# Patient Record
Sex: Female | Born: 1955 | Race: White | Hispanic: No | Marital: Married | Smoking: Former smoker
Health system: Southern US, Community
[De-identification: ages and names within clinical notes are randomized; demographics above are authoritative.]

## PROBLEM LIST (undated history)

## (undated) DIAGNOSIS — K469 Unspecified abdominal hernia without obstruction or gangrene: Secondary | ICD-10-CM

## (undated) DIAGNOSIS — E65 Localized adiposity: Secondary | ICD-10-CM | POA: Insufficient documentation

## (undated) DIAGNOSIS — F419 Anxiety disorder, unspecified: Secondary | ICD-10-CM

## (undated) DIAGNOSIS — F329 Major depressive disorder, single episode, unspecified: Secondary | ICD-10-CM

## (undated) DIAGNOSIS — E7439 Other disorders of intestinal carbohydrate absorption: Secondary | ICD-10-CM

## (undated) DIAGNOSIS — C801 Malignant (primary) neoplasm, unspecified: Secondary | ICD-10-CM

## (undated) DIAGNOSIS — G610 Guillain-Barre syndrome: Secondary | ICD-10-CM

## (undated) DIAGNOSIS — E039 Hypothyroidism, unspecified: Secondary | ICD-10-CM

## (undated) DIAGNOSIS — F32A Depression, unspecified: Secondary | ICD-10-CM

## (undated) DIAGNOSIS — E538 Deficiency of other specified B group vitamins: Secondary | ICD-10-CM

## (undated) HISTORY — DX: Malignant (primary) neoplasm, unspecified: C80.1

## (undated) HISTORY — PX: ABDOMINAL SURGERY: SHX537

## (undated) HISTORY — PX: COLONOSCOPY: SHX174

---

## 2014-06-04 DIAGNOSIS — Z9889 Other specified postprocedural states: Secondary | ICD-10-CM | POA: Insufficient documentation

## 2014-10-31 DIAGNOSIS — G40409 Other generalized epilepsy and epileptic syndromes, not intractable, without status epilepticus: Secondary | ICD-10-CM | POA: Insufficient documentation

## 2015-03-13 DIAGNOSIS — Q891 Congenital malformations of adrenal gland: Secondary | ICD-10-CM | POA: Insufficient documentation

## 2015-04-11 DIAGNOSIS — E240 Pituitary-dependent Cushing's disease: Secondary | ICD-10-CM | POA: Insufficient documentation

## 2015-05-06 DIAGNOSIS — Z6841 Body Mass Index (BMI) 40.0 and over, adult: Secondary | ICD-10-CM | POA: Insufficient documentation

## 2015-05-24 HISTORY — PX: KYPHOPLASTY: SHX5884

## 2015-12-24 DIAGNOSIS — R2231 Localized swelling, mass and lump, right upper limb: Secondary | ICD-10-CM | POA: Insufficient documentation

## 2016-03-26 DIAGNOSIS — E249 Cushing's syndrome, unspecified: Secondary | ICD-10-CM | POA: Insufficient documentation

## 2016-06-18 DIAGNOSIS — G8929 Other chronic pain: Secondary | ICD-10-CM | POA: Insufficient documentation

## 2016-06-18 DIAGNOSIS — M545 Low back pain, unspecified: Secondary | ICD-10-CM | POA: Insufficient documentation

## 2016-09-27 ENCOUNTER — Encounter (HOSPITAL_COMMUNITY): Payer: Self-pay | Admitting: *Deleted

## 2016-09-27 ENCOUNTER — Emergency Department (HOSPITAL_COMMUNITY): Payer: BC Managed Care – PPO

## 2016-09-27 ENCOUNTER — Inpatient Hospital Stay (HOSPITAL_COMMUNITY)
Admission: EM | Admit: 2016-09-27 | Discharge: 2016-10-04 | DRG: 835 | Disposition: A | Discharge status: Other Acute Inpatient Hospital | Payer: BC Managed Care – PPO | Attending: Internal Medicine | Admitting: Internal Medicine

## 2016-09-27 DIAGNOSIS — M7981 Nontraumatic hematoma of soft tissue: Secondary | ICD-10-CM | POA: Diagnosis not present

## 2016-09-27 DIAGNOSIS — K439 Ventral hernia without obstruction or gangrene: Secondary | ICD-10-CM | POA: Diagnosis present

## 2016-09-27 DIAGNOSIS — Z9884 Bariatric surgery status: Secondary | ICD-10-CM | POA: Diagnosis not present

## 2016-09-27 DIAGNOSIS — M79609 Pain in unspecified limb: Secondary | ICD-10-CM | POA: Diagnosis not present

## 2016-09-27 DIAGNOSIS — D61818 Other pancytopenia: Secondary | ICD-10-CM | POA: Diagnosis present

## 2016-09-27 DIAGNOSIS — E871 Hypo-osmolality and hyponatremia: Secondary | ICD-10-CM | POA: Diagnosis not present

## 2016-09-27 DIAGNOSIS — K5900 Constipation, unspecified: Secondary | ICD-10-CM | POA: Diagnosis not present

## 2016-09-27 DIAGNOSIS — G610 Guillain-Barre syndrome: Secondary | ICD-10-CM

## 2016-09-27 DIAGNOSIS — E538 Deficiency of other specified B group vitamins: Secondary | ICD-10-CM | POA: Diagnosis not present

## 2016-09-27 DIAGNOSIS — C9100 Acute lymphoblastic leukemia not having achieved remission: Secondary | ICD-10-CM | POA: Diagnosis not present

## 2016-09-27 DIAGNOSIS — E669 Obesity, unspecified: Secondary | ICD-10-CM | POA: Diagnosis not present

## 2016-09-27 DIAGNOSIS — Z87891 Personal history of nicotine dependence: Secondary | ICD-10-CM

## 2016-09-27 DIAGNOSIS — D649 Anemia, unspecified: Secondary | ICD-10-CM

## 2016-09-27 DIAGNOSIS — Z6832 Body mass index (BMI) 32.0-32.9, adult: Secondary | ICD-10-CM | POA: Diagnosis not present

## 2016-09-27 DIAGNOSIS — E876 Hypokalemia: Secondary | ICD-10-CM | POA: Diagnosis not present

## 2016-09-27 DIAGNOSIS — G629 Polyneuropathy, unspecified: Secondary | ICD-10-CM | POA: Diagnosis not present

## 2016-09-27 DIAGNOSIS — Z808 Family history of malignant neoplasm of other organs or systems: Secondary | ICD-10-CM | POA: Diagnosis not present

## 2016-09-27 DIAGNOSIS — E039 Hypothyroidism, unspecified: Secondary | ICD-10-CM | POA: Diagnosis present

## 2016-09-27 DIAGNOSIS — M7989 Other specified soft tissue disorders: Secondary | ICD-10-CM | POA: Diagnosis not present

## 2016-09-27 HISTORY — DX: Depression, unspecified: F32.A

## 2016-09-27 HISTORY — DX: Other disorders of intestinal carbohydrate absorption: E74.39

## 2016-09-27 HISTORY — DX: Anxiety disorder, unspecified: F41.9

## 2016-09-27 HISTORY — DX: Unspecified abdominal hernia without obstruction or gangrene: K46.9

## 2016-09-27 HISTORY — DX: Major depressive disorder, single episode, unspecified: F32.9

## 2016-09-27 HISTORY — DX: Hypothyroidism, unspecified: E03.9

## 2016-09-27 HISTORY — DX: Guillain-Barre syndrome: G61.0

## 2016-09-27 HISTORY — DX: Deficiency of other specified B group vitamins: E53.8

## 2016-09-27 LAB — POC OCCULT BLOOD, ED: Fecal Occult Bld: NEGATIVE

## 2016-09-27 LAB — COMPREHENSIVE METABOLIC PANEL
ALBUMIN: 3.3 g/dL — AB (ref 3.5–5.0)
ALK PHOS: 52 U/L (ref 38–126)
ALT: 16 U/L (ref 14–54)
ANION GAP: 14 (ref 5–15)
AST: 36 U/L (ref 15–41)
BUN: 22 mg/dL — ABNORMAL HIGH (ref 6–20)
CALCIUM: 8.3 mg/dL — AB (ref 8.9–10.3)
CHLORIDE: 97 mmol/L — AB (ref 101–111)
CO2: 20 mmol/L — AB (ref 22–32)
Creatinine, Ser: 0.65 mg/dL (ref 0.44–1.00)
GFR calc Af Amer: >60 mL/min (ref >60)
GFR calc non Af Amer: >60 mL/min (ref >60)
GLUCOSE: 122 mg/dL — AB (ref 65–99)
Potassium: 4.3 mmol/L (ref 3.5–5.1)
SODIUM: 131 mmol/L — AB (ref 135–145)
Total Bilirubin: 1.1 mg/dL (ref 0.3–1.2)
Total Protein: 6.7 g/dL (ref 6.5–8.1)

## 2016-09-27 LAB — URINALYSIS, ROUTINE W REFLEX MICROSCOPIC
BILIRUBIN URINE: NEGATIVE
Bacteria, UA: NONE SEEN
Glucose, UA: NEGATIVE mg/dL
HGB URINE DIPSTICK: NEGATIVE
Ketones, ur: 5 mg/dL — AB
Leukocytes, UA: NEGATIVE
Nitrite: NEGATIVE
Protein, ur: NEGATIVE mg/dL
RBC / HPF: NONE SEEN RBC/hpf (ref 0–5)
Specific Gravity, Urine: 1.015 (ref 1.005–1.030)
pH: 6 (ref 5.0–8.0)

## 2016-09-27 LAB — CBC
HCT: 9.3 % — ABNORMAL LOW (ref 36.0–46.0)
HEMOGLOBIN: 3.2 g/dL — AB (ref 12.0–15.0)
MCH: 31.1 pg (ref 26.0–34.0)
MCHC: 34.4 g/dL (ref 30.0–36.0)
MCV: 90.3 fL (ref 78.0–100.0)
PLATELETS: 10 10*3/uL — AB (ref 150–400)
RBC: 1.03 MIL/uL — AB (ref 3.87–5.11)
RDW: 19.9 % — ABNORMAL HIGH (ref 11.5–15.5)
WBC: 1.5 10*3/uL — ABNORMAL LOW (ref 4.0–10.5)

## 2016-09-27 LAB — FERRITIN: FERRITIN: 359 ng/mL — AB (ref 11–307)

## 2016-09-27 LAB — CBC WITH DIFFERENTIAL/PLATELET
BASOS PCT: 1 %
Basophils Absolute: 0 10*3/uL (ref 0.0–0.1)
EOS PCT: 0 %
Eosinophils Absolute: 0 10*3/uL (ref 0.0–0.7)
HEMATOCRIT: 8.8 % — AB (ref 36.0–46.0)
HEMOGLOBIN: 3 g/dL — AB (ref 12.0–15.0)
Lymphocytes Relative: 82 %
Lymphs Abs: 1.1 10*3/uL (ref 0.7–4.0)
MCH: 30.9 pg (ref 26.0–34.0)
MCHC: 34.1 g/dL (ref 30.0–36.0)
MCV: 90.7 fL (ref 78.0–100.0)
MONO ABS: 0.1 10*3/uL (ref 0.1–1.0)
MONOS PCT: 7 %
NEUTROS PCT: 10 %
Neutro Abs: 0.1 10*3/uL — ABNORMAL LOW (ref 1.7–7.7)
Platelets: 11 10*3/uL — CL (ref 150–400)
RDW: 19.9 % — ABNORMAL HIGH (ref 11.5–15.5)
WBC: 1.3 10*3/uL — CL (ref 4.0–10.5)

## 2016-09-27 LAB — DIC (DISSEMINATED INTRAVASCULAR COAGULATION) PANEL
APTT: 32 s (ref 24–36)
D DIMER QUANT: 1.44 ug{FEU}/mL — AB (ref 0.00–0.50)
FIBRINOGEN: 292 mg/dL (ref 210–475)

## 2016-09-27 LAB — IRON AND TIBC
IRON: 255 ug/dL — AB (ref 28–170)
SATURATION RATIOS: 89 % — AB (ref 10.4–31.8)
TIBC: 286 ug/dL (ref 250–450)
UIBC: 31 ug/dL

## 2016-09-27 LAB — RETICULOCYTES
RBC.: 1.3 MIL/uL — ABNORMAL LOW (ref 3.87–5.11)
RETIC CT PCT: 3.2 % — AB (ref 0.4–3.1)
Retic Count, Absolute: 41.6 10*3/uL (ref 19.0–186.0)

## 2016-09-27 LAB — HEPATIC FUNCTION PANEL
ALK PHOS: 48 U/L (ref 38–126)
ALT: 16 U/L (ref 14–54)
AST: 33 U/L (ref 15–41)
Albumin: 2.9 g/dL — ABNORMAL LOW (ref 3.5–5.0)
BILIRUBIN DIRECT: 0.2 mg/dL (ref 0.1–0.5)
BILIRUBIN INDIRECT: 0.9 mg/dL (ref 0.3–0.9)
TOTAL PROTEIN: 6 g/dL — AB (ref 6.5–8.1)
Total Bilirubin: 1.1 mg/dL (ref 0.3–1.2)

## 2016-09-27 LAB — RAPID URINE DRUG SCREEN, HOSP PERFORMED
Amphetamines: NOT DETECTED
BARBITURATES: NOT DETECTED
Benzodiazepines: NOT DETECTED
Cocaine: NOT DETECTED
Opiates: NOT DETECTED
TETRAHYDROCANNABINOL: NOT DETECTED

## 2016-09-27 LAB — DIC (DISSEMINATED INTRAVASCULAR COAGULATION)PANEL
INR: 1.32
Platelets: 10 10*3/uL — CL (ref 150–400)
Prothrombin Time: 16.5 seconds — ABNORMAL HIGH (ref 11.4–15.2)

## 2016-09-27 LAB — LACTATE DEHYDROGENASE: LDH: 203 U/L — ABNORMAL HIGH (ref 98–192)

## 2016-09-27 LAB — VITAMIN B12: Vitamin B-12: 939 pg/mL — ABNORMAL HIGH (ref 180–914)

## 2016-09-27 LAB — MRSA PCR SCREENING: MRSA BY PCR: NEGATIVE

## 2016-09-27 LAB — SAVE SMEAR

## 2016-09-27 LAB — FOLATE: Folate: 4.4 ng/mL — ABNORMAL LOW (ref >5.9)

## 2016-09-27 LAB — PREPARE RBC (CROSSMATCH)

## 2016-09-27 LAB — ABO/RH: ABO/RH(D): O POS

## 2016-09-27 MED ORDER — SODIUM CHLORIDE 0.9 % IV SOLN
250.0000 mL | INTRAVENOUS | Status: DC | PRN
  Administered 2016-09-27: 250 mL via INTRAVENOUS

## 2016-09-27 MED ORDER — DEXTROSE 5 % IV SOLN
10.0000 mg/kg | Freq: Three times a day (TID) | INTRAVENOUS | Status: DC
  Administered 2016-09-27 – 2016-10-04 (×21): 655 mg via INTRAVENOUS
  Filled 2016-09-27 (×23): qty 13.1

## 2016-09-27 MED ORDER — LEVOFLOXACIN IN D5W 750 MG/150ML IV SOLN
750.0000 mg | Freq: Once | INTRAVENOUS | Status: AC
  Administered 2016-09-27: 750 mg via INTRAVENOUS
  Filled 2016-09-27: qty 150

## 2016-09-27 MED ORDER — SODIUM CHLORIDE 0.9 % IV BOLUS (SEPSIS)
1000.0000 mL | Freq: Once | INTRAVENOUS | Status: AC
  Administered 2016-09-27: 1000 mL via INTRAVENOUS

## 2016-09-27 MED ORDER — LEVOFLOXACIN IN D5W 750 MG/150ML IV SOLN
750.0000 mg | INTRAVENOUS | Status: DC
  Administered 2016-09-28 – 2016-10-03 (×6): 750 mg via INTRAVENOUS
  Filled 2016-09-27 (×6): qty 150

## 2016-09-27 MED ORDER — SODIUM CHLORIDE 0.9% FLUSH
3.0000 mL | Freq: Two times a day (BID) | INTRAVENOUS | Status: DC
  Administered 2016-09-28 – 2016-10-04 (×9): 3 mL via INTRAVENOUS

## 2016-09-27 MED ORDER — FLUCONAZOLE IN SODIUM CHLORIDE 400-0.9 MG/200ML-% IV SOLN
400.0000 mg | INTRAVENOUS | Status: DC
Start: 2016-09-28 — End: 2016-10-04
  Administered 2016-09-28 – 2016-10-03 (×6): 400 mg via INTRAVENOUS
  Filled 2016-09-27 (×7): qty 200

## 2016-09-27 MED ORDER — ONDANSETRON HCL 4 MG/2ML IJ SOLN
4.0000 mg | Freq: Once | INTRAMUSCULAR | Status: AC
  Administered 2016-09-27: 4 mg via INTRAVENOUS
  Filled 2016-09-27: qty 2

## 2016-09-27 MED ORDER — SODIUM CHLORIDE 0.9 % IV SOLN
1000.0000 mL | INTRAVENOUS | Status: DC
  Administered 2016-09-27 – 2016-09-29 (×2): 1000 mL via INTRAVENOUS

## 2016-09-27 MED ORDER — SODIUM CHLORIDE 0.9 % IV SOLN
Freq: Once | INTRAVENOUS | Status: AC
  Administered 2016-09-27: 09:00:00 via INTRAVENOUS

## 2016-09-27 MED ORDER — SODIUM CHLORIDE 0.9% FLUSH: 3.0000 mL | INTRAVENOUS | Status: DC | PRN

## 2016-09-27 MED ORDER — FLUCONAZOLE IN SODIUM CHLORIDE 400-0.9 MG/200ML-% IV SOLN
400.0000 mg | Freq: Once | INTRAVENOUS | Status: AC
  Administered 2016-09-27: 400 mg via INTRAVENOUS
  Filled 2016-09-27: qty 200

## 2016-09-27 MED ORDER — GABAPENTIN 400 MG PO CAPS
800.0000 mg | ORAL_CAPSULE | Freq: Two times a day (BID) | ORAL | Status: DC
  Administered 2016-09-28 – 2016-10-04 (×14): 800 mg via ORAL
  Filled 2016-09-27 (×14): qty 2

## 2016-09-27 NOTE — ED Provider Notes (Signed)
[facility] DEPT Provider Note   CSN: [id number] Arrival date & time: 09/27/16  0558     History   Chief Complaint Chief Complaint  Patient presents with  . Shortness of Breath    HPI Morgan Juarez is a 61 y.o. female presenting with 3 weeks shortness of breath.  Level V caveat as patient states she is unable to answer or doesn't know the answer to many questions.  Patient states that she has a history of Guillain Barre, and since then she has had difficulty breathing. She was first diagnosed 3 years ago. In the past 3 weeks, she reports increasing shortness of breath and weakness. She was seen by her primary care doctor in [place] last week, but does not remember what she was told about her diagnosis. She reports shortness of breath without associated chest pain. Shortness of breath is constant, not worsened with increased activity. She reports chills yesterday, but denies fevers, cough, vomiting, or abdominal pain. She reports frequent episodes of nausea, and states she uses Phenergan to help with the symptoms. She denies urinary sxs and states she has normal BMs without blood.  Patient states that she only takes gabapentin and Cymbalta, and was taken off her [place] recently. 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 [facility] review shows patient is on Klonopin and oxymorphone as well.  Per triage note, patient has a several year history of shortness of breath, and was recently explained by a diagnosis of with abdominal hernia pressing on the diaphragm, for which she will have surgery next week. Husband told EMS that baseline patient is confused and hallucinates, and prior to calling EMS, patient and her husband were arguing. Pt with h/o bowel obstruction 3 yrs prior, but not recent surgeries. At time of arrival in ED, there were no previous medical records visible through Epic or [place].   HPI  Past Medical  History:  Diagnosis Date  . Abdominal hernia   . Anxiety   . B12 deficiency   . Depression   . Glucose intolerance   . Guillain Barr syndrome ([place])   . Hypothyroidism     Patient Active Problem List   Diagnosis Date Noted  . Pancytopenia ([place]) 09/27/2016  . Hyponatremia 09/27/2016  . Anemia requiring transfusions     Past Surgical History:  Procedure Laterality Date  . ABDOMINAL SURGERY    . COLONOSCOPY    . KYPHOPLASTY  05/24/2015    OB History    No data available       Home Medications    Prior to Admission medications   Not on File    Family History Family History  Problem Relation Age of Onset  . Intracerebral hemorrhage Mother   . CAD Father   . Skin cancer Father     Social History Social History  Substance Use Topics  . Smoking status: Former Smoker    Types: Cigarettes    Quit date: 02/22/2013  . Smokeless tobacco: Never Used  . Alcohol use No     Allergies   Patient has no known allergies.   Review of Systems Review of Systems  Constitutional: Negative for chills and fever.  HENT: Negative for congestion and sore throat.   Eyes: Negative for photophobia and visual disturbance.  Respiratory: Positive for shortness of breath. Negative for cough, chest tightness and wheezing.   Cardiovascular: Negative for chest pain and leg swelling.  Gastrointestinal:  Positive for nausea. Negative for abdominal pain, constipation, diarrhea and vomiting.  Genitourinary: Negative for dysuria, frequency and hematuria.  Musculoskeletal: Negative for back pain, neck pain and neck stiffness.  Skin: Negative for rash.  Neurological: Negative for dizziness and light-headedness.  Psychiatric/Behavioral: Positive for confusion.     Physical Exam Updated Vital Signs BP (!) 147/85   Pulse 82   Temp 97.8 F (36.6 C) (Oral)   Resp 16   Ht 5\' 4"  (1.626 m)   Wt 81.6 kg (180 lb)   SpO2 96%   BMI 30.90 kg/m   Physical Exam  Constitutional: She is  oriented to person, place, and time. She appears well-developed and well-nourished.  HENT:  Head: Normocephalic and atraumatic.  Mouth/Throat: Uvula is midline and oropharynx is clear and moist. Mucous membranes are pale and dry.  Eyes: Pupils are equal, round, and reactive to light. Conjunctivae and EOM are normal.  Neck: Normal range of motion. Neck supple.  Cardiovascular: Regular rhythm and intact distal pulses.  Tachycardia present.   Pulmonary/Chest: Effort normal and breath sounds normal. No respiratory distress. She has no wheezes.  Lung sounds clear throughout all fields, patient does not appear to be in any respiratory distress. O2 sats at 100% on room air.  Abdominal: Soft. Bowel sounds are normal. She exhibits no distension. There is no tenderness. There is no guarding.  Musculoskeletal: Normal range of motion.  Lymphadenopathy:    She has no cervical adenopathy.  Neurological: She is alert and oriented to person, place, and time.  Skin: Skin is warm and dry. There is pallor.  Psychiatric:  Patient cannot answer many questions- unclear if this is due to confusion, uncooperativeness, or other cause.  Nursing note and vitals reviewed.    ED Treatments / Results  Labs (all labs ordered are listed, but only abnormal results are displayed) Labs Reviewed  URINALYSIS, ROUTINE W REFLEX MICROSCOPIC - Abnormal; Notable for the following:       Result Value   Ketones, ur 5 (*)    Squamous Epithelial / LPF 0-5 (*)    All other components within normal limits  CBC - Abnormal; Notable for the following:    WBC 1.5 (*)    RBC 1.03 (*)    Hemoglobin 3.2 (*)    HCT 9.3 (*)    RDW 19.9 (*)    Platelets 10 (*)    All other components within normal limits  COMPREHENSIVE METABOLIC PANEL - Abnormal; Notable for the following:    Sodium 131 (*)    Chloride 97 (*)    CO2 20 (*)    Glucose, Bld 122 (*)    BUN 22 (*)    Calcium 8.3 (*)    Albumin 3.3 (*)    All other components within  normal limits  CBC WITH DIFFERENTIAL/PLATELET - Abnormal; Notable for the following:    WBC 1.3 (*)    RBC <1.00 (*)    Hemoglobin 3.0 (*)    HCT 8.8 (*)    RDW 19.9 (*)    Platelets 11 (*)    Neutro Abs 0.1 (*)    All other components within normal limits  VITAMIN B12 - Abnormal; Notable for the following:    Vitamin B-12 939 (*)    All other components within normal limits  FOLATE - Abnormal; Notable for the following:    Folate 4.4 (*)    All other components within normal limits  IRON AND TIBC - Abnormal; Notable for the following:  Iron 255 (*)    Saturation Ratios 89 (*)    All other components within normal limits  FERRITIN - Abnormal; Notable for the following:    Ferritin 359 (*)    All other components within normal limits  RETICULOCYTES - Abnormal; Notable for the following:    Retic Ct Pct 3.2 (*)    RBC. 1.30 (*)    All other components within normal limits  DIC (DISSEMINATED INTRAVASCULAR COAGULATION) PANEL - Abnormal; Notable for the following:    Prothrombin Time 16.5 (*)    D-Dimer, Quant 1.44 (*)    Platelets 10 (*)    All other components within normal limits  HEPATIC FUNCTION PANEL - Abnormal; Notable for the following:    Total Protein 6.0 (*)    Albumin 2.9 (*)    All other components within normal limits  LACTATE DEHYDROGENASE - Abnormal; Notable for the following:    LDH 203 (*)    All other components within normal limits  MRSA PCR SCREENING  CULTURE, BLOOD (ROUTINE X 2)  CULTURE, BLOOD (ROUTINE X 2)  RAPID URINE DRUG SCREEN, HOSP PERFORMED  SAVE SMEAR  PROTEIN ELECTROPHORESIS, SERUM  COMPREHENSIVE METABOLIC PANEL  CBC  CMV IGM  EPSTEIN-BARR VIRUS VCA, IGG  EPSTEIN-BARR VIRUS VCA, IGM  EHRLICHIA ANTIBODY PANEL  HIV ANTIBODY (ROUTINE TESTING)  LYME DISEASE, WESTERN BLOT  RSV(RESPIRATORY SYNCYTIAL VIRUS) AB, BLOOD  I-STAT CHEM 8, ED  POC OCCULT BLOOD, ED  TYPE AND SCREEN  PREPARE RBC (CROSSMATCH)  ABO/RH    EKG  EKG  Interpretation None       Radiology Dg Chest 2 View  Result Date: 09/27/2016 CLINICAL DATA:  Initial evaluation for worsening shortness of breath over past few days. EXAM: CHEST  2 VIEW COMPARISON:  None available. FINDINGS: Cardiac silhouette partially obscured, but grossly within normal limits. Mediastinal silhouette normal. Aortic atherosclerosis. Lungs are hypoinflated. Linear left perihilar opacities most consistent with atelectasis. No focal infiltrates. No pulmonary edema or pleural effusion. No pneumothorax. Chronic compression deformity involving the upper lumbar spine, likely L1 level. No acute osseus abnormality. IMPRESSION: 1. Shallow lung inflation with left perihilar atelectasis. No other active cardiopulmonary disease. 2. Chronic compression deformity involving the L1 vertebral body with secondary accentuated thoracolumbar kyphosis. Electronically Signed   By: Jeannine Boga M.D.   On: 09/27/2016 06:54    Procedures Procedures (including critical care time)  Medications Ordered in ED Medications  sodium chloride 0.9 % bolus 1,000 mL (0 mLs Intravenous Stopped 09/27/16 1001)    Followed by  0.9 %  sodium chloride infusion (0 mLs Intravenous Stopped 09/27/16 1424)  sodium chloride flush (NS) 0.9 % injection 3 mL (3 mLs Intravenous Not Given 09/28/16 1000)  sodium chloride flush (NS) 0.9 % injection 3 mL (not administered)  0.9 %  sodium chloride infusion (250 mLs Intravenous New Bag/Given 09/27/16 1425)  fluconazole (DIFLUCAN) IVPB 400 mg (not administered)  levofloxacin (LEVAQUIN) IVPB 750 mg (not administered)  acyclovir (ZOVIRAX) 655 mg in dextrose 5 % 100 mL IVPB (655 mg Intravenous New Bag/Given 09/28/16 1156)  gabapentin (NEURONTIN) capsule 800 mg (800 mg Oral Given 09/28/16 0953)  ondansetron (ZOFRAN) injection 4 mg (4 mg Intravenous Given 09/27/16 0659)  0.9 %  sodium chloride infusion ( Intravenous Stopped 09/28/16 0811)  fluconazole (DIFLUCAN) IVPB 400 mg (0 mg Intravenous  Stopped 09/27/16 2129)  levofloxacin (LEVAQUIN) IVPB 750 mg (0 mg Intravenous Stopped 09/27/16 2249)     Initial Impression / Assessment and Plan / ED Course  I have reviewed the triage vital signs and the nursing notes.  Pertinent labs & imaging results that were available during my care of the patient were reviewed by me and considered in my medical decision making (see chart for details).  Clinical Course as of Sep 28 1257  Mon Sep 27, 2016  4782 Patient presenting with several years shortness of breath. History is confusing, as patient is not forthcoming with answers. I do not have access to any previous records. As the pt is c/o shortness of breath, will order EKG, EKG, basic labs. UA and UDS ordered. Zofran given for nausea. Patient appears dry, will give bolus of fluids. Patient denying any pain at this time. Physical exam reassuring sats are 100%, and lung sounds are clear, however pt appears pale. Patient does not appear in any respiratory distress at this time.  [[place]]  0745 Informed that the initial blood draws were concerning for being watered-down, and believed to be inaccurate. Labs had be redrawn for accurate results.  [[place]]  0836 Hbg 3.2 and plts 10. Overall pancytopenia. Will start blood transfusion. Will call critical care for admission. Discussed case with attending, and Dr. Roderic Palau has evaluated the patient. Hemoccult negative. No obvious source of bleeding.  [[place]]  9562 Discussed case with Dr. Elsworth Soho from critical care. They will assess the patient, and decide if patient can be admitted to hospitalist. Additionally, will consult hematology for further management.   [[place]]  0909 Dr. Roderic Palau discussed case with Dr. Julien Nordmann from hematology, and heme will evaluate the patient after admission. Will continue with transfusion, and no other therapy recommended at this time.  [[place]]  1030 Discussed with critical care, and advised the patient is stable for hospitalist admission.  [[place]]  1055 Discussed  case with Dr. Maudie Mercury, and patient to be admitted to stepdown unit.  [[place]]    Clinical Course User Index [[place]] Taiden Raybourn, PA-C    Critical Care Time: 45 minutes    Final Clinical Impressions(s) / ED Diagnoses   Final diagnoses:  Anemia requiring transfusions    New Prescriptions There are no discharge medications for this patient.    Franchot Heidelberg, PA-C 09/28/16 1259    Milton Ferguson, MD 09/30/16 [phone]

## 2016-09-27 NOTE — H&P (Signed)
[facility] H&P   Patient Demographics:    Morgan Juarez, is a 61 y.o. female  MRN: [id number]   DOB - 1955-08-01  Admit Date - 09/27/2016  Outpatient Primary MD for the patient is [facility] [place], [place] Referring MD/NP/PA: Roderic Palau  Outpatient Specialists:   Patient coming from: home  Chief Complaint  Patient presents with  . Shortness of Breath      HPI:    Morgan Juarez  is a 61 y.o. 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  Pt denies tick bite,  No prior history of Hiv, Hepatitis B/C,  Pt denies prior blood transfusion.  Pt denies brbpr, black stool.   Pt started saxenda for weight loss recently about w/in the past 90 days along with Linzess. Marland Kitchen  Pt presented to ED   In ED, pt was found to have pancytopenia and signficant anemia,  hgb 3.2  Plt 10,  Wbc 1.5,  Pt  Will be admitted for pancytopenia.      Review of systems:    In addition to the HPI above,  No Fever-chills, No Headache, No changes with Vision or hearing, No problems swallowing food or Liquids, No Chest pain, Cough or  No Abdominal pain, No Nausea or Vommitting, Bowel movements are regular, No Blood in stool or Urine, No dysuria, No new skin rashes or bruises, No new joints pains-aches,  No new weakness, tingling, numbness in any extremity, No recent weight gain or loss, No polyuria, polydypsia or polyphagia, No significant Mental Stressors.  A full 10 point Review of Systems was done, except as stated above, all other Review of Systems were negative.   With Past History of the following :    Past Medical History:  Diagnosis Date  . Abdominal hernia   . Anxiety   . B12 deficiency   .  Depression   . Glucose intolerance   . Guillain Barr syndrome ([place])   . Hypothyroidism       Past Surgical History:  Procedure Laterality Date  . ABDOMINAL SURGERY    . COLONOSCOPY    . KYPHOPLASTY  05/24/2015      Social History:     Social History  Substance Use Topics  . Smoking status: Former Smoker    Types: Cigarettes    Quit date: 02/22/2013  . Smokeless tobacco: Never Used  . Alcohol use No     Lives - at home  Mobility - walk by self, limited, walks with walker or cane   Family History :     Family History  Problem Relation Age of Onset  . Intracerebral hemorrhage Mother   . CAD Father   . Skin cancer Father  Home Medications:   Prior to Admission medications   Not on File     Allergies:    No Known Allergies   Physical Exam:   Vitals  Blood pressure 125/71, pulse 90, temperature 98 F (36.7 C), temperature source Oral, resp. rate 17, height _0  (1.626 m), weight 81.6 kg (180 lb), SpO2 100 %.   1. General  lying in bed in NAD,    2. Normal affect and insight, Not Suicidal or Homicidal, Awake Alert, Oriented X 3.  3. No F.N deficits, ALL C.Nerves Intact, Strength 5/5 all 4 extremities, Sensation intact all 4 extremities, Plantars down going.  4. Ears and Eyes appear Normal, Conjunctivae pale, PERRLA. Moist Oral Mucosa.  5. Supple Neck, No JVD, No cervical lymphadenopathy appriciated, No Carotid Bruits.  6. Symmetrical Chest wall movement, Good air movement bilaterally, CTAB.  7. RRR, No Gallops, Rubs or Murmurs, No Parasternal Heave.  8. Positive Bowel Sounds, Abdomen Soft, No tenderness, No organomegaly appriciated,No rebound -guarding or rigidity.  9.  No Cyanosis, Normal Skin Turgor, No Skin Rash or Bruise.  10. Good muscle tone,  joints appear normal , no effusions, Normal ROM.  11. No Palpable Lymph Nodes in Neck or Axillae    Data Review:    CBC  Recent Labs Lab 09/27/16 0734 09/27/16 0845  WBC 1.5* 1.3*  HGB  3.2* 3.0*  HCT 9.3* 8.8*  PLT 10* 11*  MCV 90.3 90.7  MCH 31.1 30.9  MCHC 34.4 34.1  RDW 19.9* 19.9*  LYMPHSABS  --  1.1  MONOABS  --  0.1  EOSABS  --  0.0  BASOSABS  --  0.0   ------------------------------------------------------------------------------------------------------------------  Chemistries   Recent Labs Lab 09/27/16 0734  NA 131*  K 4.3  CL 97*  CO2 20*  GLUCOSE 122*  BUN 22*  CREATININE 0.65  CALCIUM 8.3*  AST 36  ALT 16  ALKPHOS 52  BILITOT 1.1   ------------------------------------------------------------------------------------------------------------------ estimated creatinine clearance is 75.4 mL/min (by C-G formula based on SCr of 0.65 mg/dL). ------------------------------------------------------------------------------------------------------------------ No results for input(s): TSH, T4TOTAL, T3FREE, THYROIDAB in the last 72 hours.  Invalid input(s): FREET3  Coagulation profile No results for input(s): INR, PROTIME in the last 168 hours. ------------------------------------------------------------------------------------------------------------------- No results for input(s): DDIMER in the last 72 hours. -------------------------------------------------------------------------------------------------------------------  Cardiac Enzymes No results for input(s): CKMB, TROPONINI, MYOGLOBIN in the last 168 hours.  Invalid input(s): CK ------------------------------------------------------------------------------------------------------------------ No results found for: BNP   ---------------------------------------------------------------------------------------------------------------  Urinalysis    Component Value Date/Time   COLORURINE YELLOW 09/27/2016 [place] 09/27/2016 0834   LABSPEC 1.015 09/27/2016 0834   PHURINE 6.0 09/27/2016 [place] 09/27/2016 0834   HGBUR NEGATIVE 09/27/2016 0834    BILIRUBINUR NEGATIVE 09/27/2016 0834   KETONESUR 5 (A) 09/27/2016 0834   PROTEINUR NEGATIVE 09/27/2016 0834   NITRITE NEGATIVE 09/27/2016 0834   LEUKOCYTESUR NEGATIVE 09/27/2016 0834    ----------------------------------------------------------------------------------------------------------------   Imaging Results:    Dg Chest 2 View  Result Date: 09/27/2016 CLINICAL DATA:  Initial evaluation for worsening shortness of breath over past few days. EXAM: CHEST  2 VIEW COMPARISON:  None available. FINDINGS: Cardiac silhouette partially obscured, but grossly within normal limits. Mediastinal silhouette normal. Aortic atherosclerosis. Lungs are hypoinflated. Linear left perihilar opacities most consistent with atelectasis. No focal infiltrates. No pulmonary edema or pleural effusion. No pneumothorax. Chronic compression deformity involving the upper lumbar spine, likely L1 level. No acute osseus abnormality. IMPRESSION: 1. Shallow lung inflation with left perihilar  atelectasis. No other active cardiopulmonary disease. 2. Chronic compression deformity involving the L1 vertebral body with secondary accentuated thoracolumbar kyphosis. Electronically Signed   By: Jeannine Boga M.D.   On: 09/27/2016 06:54      Assessment & Plan:    Active Problems:   Pancytopenia ([facility])   Hyponatremia    Pancytopenia Repeat cbc after transfusion of 4 units prbc Check LDH, lyme, ehrlichiosis, smear,  May need bone marrow  Anemia B12 deficiency Transfuse with 4 units prbc Check b12, folate, ferritin, iron/tibc, spep, upep, esr, LDH  Hyponatremia Check serum osm, tsh, cortisol Check urine sodium, urine osm Check cmp after transfusion  Hypothyroidism Check tsh   DVT Prophylaxis  SCDs   AM Labs Ordered, also please review Full Orders  Family Communication: Admission, patients condition and plan of care including tests being ordered have been discussed with the patient  who indicate understanding  and agree with the plan and Code Status.  Code Status FULL CODE  Likely DC to  home  Condition GUARDED    Consults called: pccm,  Oncology per ED  Admission status: inpatient  Time spent in minutes : 45 minutes   Jani Gravel M.D on 09/27/2016 at 11:51 AM  Between 7am to 7pm - Pager - [phone]. After 7pm go to [email or web address] - password [facility]  [facility] - Office  [phone]

## 2016-09-27 NOTE — ED Notes (Signed)
20 minute timer started 

## 2016-09-27 NOTE — Consult Note (Signed)
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 Subsequently, rapid deterioration of performance status with fatigue, constipation, decreasing oral intake of fluids and food. On presentation, profound neutropenia with absolute neutrophil count 0.1, life-threatening critical anemia with hemoglobin of 3.0 and severe thrombocytopenia with platelets of 1.0.  Differential here includes direct injury of the bone marrow by a viral pathogens or a tickborne illnesses although the patient is reasonably homebound and unlikely to be exposed to ticks easily, chemical injury to the bone marrow, post viral aplastic anemia, acute leukemia. At this time, peripheral blood smear demonstrates no profound schistocytosis. There is not a definite evidence of hemolysis on the labwork either with only minimal elevation of LDH, normal bilirubin. I do not believe that a microangiopathic hemolytic process is taking place at this time. Review of the medication list reveals only promethazine as a potential medication to cause agranulocytosis cytopenia. It leaves profound anemia unexplained. For me, acute leukemia, myelodysplastic syndrome and post viral aplastic anemia are highest in differential. The timeline, the latter is more likely in my opinion. Patient exhibits no symptoms or signs of recurrence of Guillain-Barr this point in time.  First priority of management are hematological support with blood and platelet transfusions with the goals of bring hemoglobin up to 7.0 and platelets over 15,000. Second priority would be prophylaxis of additional  infections development due to the current permissive environment in the patient. Subsequently, urgent workup for possible severe underlying hematological disorder will need to be conducted. My recommendations will be listed below  Recommendations: --Transfuse pRBC to goal of Hgb 7.0; all products leukoreduced and irradiated --Transfuse Plt to keep Plt =>15, all products look reduced and irradiated --Fibrinogen Q12hrs -- keep fibrinogen over 200, and treat with cryoprecipitate if it drops below --Start Levofloxacin, fluconazole and acyclovir for infection prophylaxis --Consult IR for BM Bx tomorrow AM   Thank you for involving [place] in the care of this patient. Our service will continue following the patient on daily basis and we will update our recommendations as more information becomes available.   Orders Placed This Encounter  Procedures  . Culture, blood (Routine X 2) w Reflex to ID Panel    Standing Status:   Standing    Number of Occurrences:   2  . MRSA PCR Screening    Standing Status:   Standing    Number of Occurrences:   1  . DG Chest 2 View    Standing Status:   Standing    Number of Occurrences:   1    Order Specific Question:   Reason for Exam (SYMPTOM  OR DIAGNOSIS REQUIRED)    Answer:   SOB  . Urinalysis, Routine w reflex microscopic    Standing Status:   Standing    Number of Occurrences:   1  . Rapid urine drug screen (hospital performed)    Standing Status:   Standing    Number of Occurrences:   1  . CBC    Standing Status:   Standing    Number of Occurrences:   1  . Comprehensive metabolic panel    Standing Status:   Standing    Number of Occurrences:   1  . CBC with Differential/Platelet  Standing Status:   Standing    Number of Occurrences:   1  . Vitamin B12    Standing Status:   Standing    Number of Occurrences:   1  . Folate    Standing Status:   Standing    Number of Occurrences:   1  . Iron and TIBC    Standing Status:   Standing    Number of  Occurrences:   1  . Ferritin    Standing Status:   Standing    Number of Occurrences:   1  . Reticulocytes    Standing Status:   Standing    Number of Occurrences:   1  . Save smear    Standing Status:   Standing    Number of Occurrences:   1  . DIC (disseminated intravasc coag) panel    Standing Status:   Standing    Number of Occurrences:   1  . Hepatic function panel    Standing Status:   Standing    Number of Occurrences:   1  . Epstein-Barr virus VCA, IgG    Standing Status:   Standing    Number of Occurrences:   1  . Epstein-Barr virus VCA, IgM    Standing Status:   Standing    Number of Occurrences:   1  . RSV(respiratory syncytial virus) ab    Standing Status:   Standing    Number of Occurrences:   1  . CMV IgM    Standing Status:   Standing    Number of Occurrences:   1  . Lyme disease, western blot    Standing Status:   Standing    Number of Occurrences:   1  . Ehrlichia antibody panel    Standing Status:   Standing    Number of Occurrences:   1  . Lactate dehydrogenase    Standing Status:   Standing    Number of Occurrences:   1  . Protein electrophoresis, serum    Standing Status:   Standing    Number of Occurrences:   1  . Sedimentation rate    Standing Status:   Standing    Number of Occurrences:   1  . HIV antibody (Routine Testing)    Standing Status:   Standing    Number of Occurrences:   1  . Comprehensive metabolic panel    Standing Status:   Standing    Number of Occurrences:   1  . CBC    Standing Status:   Standing    Number of Occurrences:   1  . Diet regular Room service appropriate? Yes; Fluid consistency: Thin    Standing Status:   Standing    Number of Occurrences:   1    Order Specific Question:   Room service appropriate?    Answer:   Yes    Order Specific Question:   Fluid consistency:    Answer:   Thin  . Practitioner attestation of consent    I, the ordering practitioner, attest that I have discussed with the patient the  benefits, risks, side effects, alternatives, likelihood of achieving goals and potential problems during recovery for the procedure listed.    Standing Status:   Standing    Number of Occurrences:   1    Order Specific Question:   Procedure    Answer:   Blood product(s)  . Complete patient signature process for consent form    Standing  Status:   Standing    Number of Occurrences:   1  . Expected discharge Date: 09/30/2016; Planned Disposition: Home    Standing Status:   Standing    Number of Occurrences:   1    Order Specific Question:   Date    Answer:   09/30/2016    Order Specific Question:   Planned Disposition    Answer:   Home  . Vital signs    Standing Status:   Standing    Number of Occurrences:   1  . Notify physician    Standing Status:   Standing    Number of Occurrences:   20    Order Specific Question:   Notify Physician    Answer:   for pulse less than 55 or greater than 120    Order Specific Question:   Notify Physician    Answer:   for respiratory rate less than 12 or greater than 25    Order Specific Question:   Notify Physician    Answer:   for temperature greater than 100.5 F    Order Specific Question:   Notify Physician    Answer:   for urinary output less than 30 mL/kg/hr for four hours    Order Specific Question:   Notify Physician    Answer:   for systolic BP less than 90 or greater than 962, diastolic BP less than 60 or greater than 100  . RN may order General Admission PRN Orders utilizing "General Admission PRN medications" (through manage orders) for the following patient needs: allergy symptoms (Claritin), cold sores (Carmex), cough (Robitussin DM), eye irritation (Liquifilm Tears), hemorrhoids (Tucks), indigestion (Maalox), minor skin irritation (Hydrocortisone Cream), muscle pain Suezanne Jacquet Gay), nose irritation (saline nasal spray) and sore throat (Chloraseptic spray).    Standing Status:   Standing    Number of Occurrences:   [id number]  . SCDs    Standing Status:    Standing    Number of Occurrences:   1    Order Specific Question:   Laterality    Answer:   Bilateral  . Cardiac Monitoring Continuous x 24 hours Indications for use: Other; other indications for use: tachycardia    Standing Status:   Standing    Number of Occurrences:   1    Order Specific Question:   Indications for use:    Answer:   Other    Order Specific Question:   other indications for use:    Answer:   tachycardia  . Up with assistance    Standing Status:   Standing    Number of Occurrences:   [id number]  . Full code    Standing Status:   Standing    Number of Occurrences:   1  . Consult to intensivist  [phone]    [phone]    Standing Status:   Standing    Number of Occurrences:   1    Order Specific Question:   Place call to:    Answer:   critical care    Order Specific Question:   Reason for Consult    Answer:   Admit  . Consult to hospitalist  [phone]    [phone]    Standing Status:   Standing    Number of Occurrences:   1    Order Specific Question:   Place call to:    Answer:   [employer] photographer Question:   Reason for Consult  Answer:   Admit  . Pulse oximetry check with vital signs    Standing Status:   Standing    Number of Occurrences:   1  . I-Stat Chem 8, ED    Standing Status:   Standing    Number of Occurrences:   1  . POC occult blood, ED Provider will collect    Standing Status:   Standing    Number of Occurrences:   1    Order Specific Question:   Specimen to be collected by?    Answer:   Provider will collect  . EKG 12-Lead    Standing Status:   Standing    Number of Occurrences:   1  . Type and screen    Standing Status:   Standing    Number of Occurrences:   1  . Prepare RBC    Standing Status:   Standing    Number of Occurrences:   1    Order Specific Question:   # of Units    Answer:   2 units    Order Specific Question:   Transfusion Indications    Answer:   Symptomatic Anemia    Order Specific Question:   If emergent  release call blood bank    Answer:   [facility] [phone]  . ABO/Rh    Standing Status:   Standing    Number of Occurrences:   1  . Admit to Inpatient (patient's expected length of stay will be greater than 2 midnights or inpatient only procedure)    Standing Status:   Standing    Number of Occurrences:   1    Order Specific Question:   Hospital Area    Answer:   [facility] [[id number]]    Order Specific Question:   Level of Care    Answer:   Stepdown [14]    Order Specific Question:   Admit to SDU based on following criteria    Answer:   Hemodynamic compromise or significant risk of instability:  Patient requiring short term acute titration and management of vasoactive drips, and invasive monitoring (i.e., CVP and Arterial line).    Order Specific Question:   Diagnosis    Answer:   Pancytopenia [facility]) [[id number]]    Order Specific Question:   Admitting Physician    Answer:   Jani Gravel [[id number]]    Order Specific Question:   Attending Physician    Answer:   Jani Gravel [[id number]]    Order Specific Question:   Estimated length of stay    Answer:   past midnight tomorrow    Order Specific Question:   Certification:    Answer:   I certify this patient will need inpatient services for at least 2 midnights    Order Specific Question:   PT Class (Do Not Modify)    Answer:   Inpatient [101]    Order Specific Question:   PT Acc Code (Do Not Modify)    Answer:   Private [1]    All questions were answered.  . The patient knows to call the clinic with any problems, questions or concerns.  This note was electronically signed.    History of Presenting Illness Morgan Juarez 61 y.o. referred for hematological consultation afterPresenting to the emergency room by ambulance with complaints of progressive shortness of breath over several days. In the emergency room, patient was found to be profoundly pancytopenic with hemoglobin of 30.0 and platelet count of 10,000. White blood  cell count  is also severely depressed with white blood cell count of 1.3 neutrophil count of 0.1. Patient was admitted to the hospital for further evaluation and we were consulted to assist in the assessment.  On talking to the patient and her husband, she has been declining over approximately 3 weeks with accelerated decline over the past several days. Prior to the onset of the current symptoms, patient had a viral illness with blisters over her lips, cervical lymphadenopathy, abdominal discomfort in the epigastrium, and diarrhea. Subsequently, she had a rapid drop of appetite, weakness, fatigue, and further deterioration of overall energy level and strength. Presently, she continues to have drenching sweats without fevers or chills. She has intermittent nausea, but no or vomiting. Patient reports no bowel movements for the past 3 days. Denies epistaxis, gum bleeding, excessive bruising, persistent lymphadenopathy. She continues to be short of breath although the breathing is improving after she has received 2 units of packed red blood cells. Continues to have nausea, but urinary symptoms. She denies any new focal weakness or sensory deficits and her extremities or face.   Her past medical history is extensive and quite interesting. Previously, patient had abdominal surgeries and subsequently developed lesions which required additional surgical intervention. 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 Following colostomy takedown, patient has developed large ventral hernia which has been a source of significant number of symptoms. She was being prepared to undergo hernia repair surgery in [place], [place]. She has been previously lived there and received older care locally there until recently they have moved to [place] to live closer to their family. It appears that she has developed a Guillain-Barr syndrome following the colostomy takedown surgery following the  administration of influenza vaccine. She was treated with plasma exchange at that time, the initial treatment was complicated with possible seizure and patient was treated with Keppra until recently. No recurrent seizures with subsequent treatments. Patient's lower extremities have recovered substantially, but not 100% following the treatment. She denies any recent exacerbation of her symptoms.  Based on review of the external records, patient's medication list included levothyroxine, liraglutide (Saxenda), topiramate gabapentin, promethazine, linaclotide (Linzess), oxymorphone among others. Patient has not been using Linzess as it resulted in severe diarrhea shortly after initiation. More recently, though the medication has been using was gabapentin and liraglutide.   Oncological/hematological History: --Labs, 06/06/14: WBC 11.6, ANC 7.7, ALC 1.9, Mono 0.9, Baso 0.0, Hgb 10.7, Plt 219; --Labs, 09/27/16: WBC 1.3, ANC 0.1, ALC 1.1, Mono 0.1, Baso 0.0, Hgb 3.0, Retic 3.2%, aRetic 41.6, Plt 11;  Medical History: Past Medical History:  Diagnosis Date  . Abdominal hernia   . Anxiety   . B12 deficiency   . Depression   . Glucose intolerance   . Guillain Barr syndrome ([place])   . Hypothyroidism     Surgical History: Past Surgical History:  Procedure Laterality Date  . ABDOMINAL SURGERY    . COLONOSCOPY    . KYPHOPLASTY  05/24/2015    Family History: Family History  Problem Relation Age of Onset  . Intracerebral hemorrhage Mother   . CAD Father   . Skin cancer Father     Social History: Social History   Social History  . Marital status: Married    Spouse name: N/A  . Number of children: N/A  . Years of education: N/A   Occupational History  . Not on file.   Social History Main Topics  . Smoking status: Former Smoker  Types: Cigarettes    Quit date: 02/22/2013  . Smokeless tobacco: Never Used  . Alcohol use No  . Drug use: No  . Sexual activity: Not on file   Other  Topics Concern  . Not on file   Social History Narrative  . No narrative on file    Allergies: No Known Allergies  Medications:  Current Facility-Administered Medications  Medication Dose Route Frequency Provider Last Rate Last Dose  . 0.9 %  sodium chloride infusion  1,000 mL Intravenous Continuous Caccavale, Sophia, PA-C   Stopped at 09/27/16 1424  . 0.9 %  sodium chloride infusion  250 mL Intravenous PRN Jani Gravel, MD 10 mL/hr at 09/27/16 1425 250 mL at 09/27/16 1425  . sodium chloride flush (NS) 0.9 % injection 3 mL  3 mL Intravenous Q12H Jani Gravel, MD      . sodium chloride flush (NS) 0.9 % injection 3 mL  3 mL Intravenous PRN Jani Gravel, MD        Review of Systems: Review of Systems  Constitutional: Positive for appetite change, diaphoresis, fatigue and unexpected weight change. Negative for chills and fever.  HENT:   Positive for mouth sores. Negative for hearing loss, lump/mass, nosebleeds, sore throat, tinnitus, trouble swallowing and voice change.   Eyes: Negative for eye problems and icterus.  Respiratory: Positive for shortness of breath. Negative for chest tightness, cough, hemoptysis and wheezing.   Cardiovascular: Negative for chest pain, leg swelling and palpitations.  Gastrointestinal: Positive for abdominal distention, abdominal pain, constipation and nausea. Negative for blood in stool, diarrhea, rectal pain and vomiting.  Endocrine: Negative for hot flashes.  Genitourinary: Negative.    Musculoskeletal: Negative.   Skin: Negative.   Hematological: Positive for adenopathy. Does not bruise/bleed easily.  All other systems reviewed and are negative.    PHYSICAL EXAMINATION Blood pressure (P) 138/71, pulse (P) 83, temperature (P) 97.7 F (36.5 C), temperature source (P) Axillary, resp. rate (P) 12, height 5' 4"  (1.626 m), weight 180 lb (81.6 kg), SpO2 (P) 100 %.  ECOG PERFORMANCE STATUS: 4 - Bedbound  Physical Exam  Constitutional: She is oriented to  person, place, and time. Vital signs are normal. She appears not lethargic, to not be writhing in pain, not malnourished, not dehydrated and not jaundiced. She appears unhealthy. She appears not cachectic. She appears toxic. She has a sickly appearance. She appears distressed.  HENT:  Head: Atraumatic.  Mouth/Throat: Oropharynx is clear and moist and mucous membranes are normal. Oral lesions present.  Left upper lip with a cluster of crusted erosion. no active drainage, no erythema of the skin on mucosa, no significant swelling.  Eyes: Pupils are equal, round, and reactive to light. Conjunctivae and EOM are normal. Right eye exhibits no discharge and no exudate. Left eye exhibits no discharge and no exudate. No scleral icterus.  Neck: No JVD present. No tracheal tenderness present. Carotid bruit is not present. No thyroid mass and no thyromegaly present.  Cardiovascular: Regular rhythm, S1 normal and S2 normal.  Tachycardia present.  Exam reveals no gallop and no distant heart sounds.   No murmur heard. Pulmonary/Chest: Breath sounds normal. No stridor. She has no wheezes.  Abdominal:  Abdomen that appears to be asymmetric due to a large ventral hernia. Abdomen appears to be soft, nondistended. It is tender to palpation over the epigastrium and left flank. No rebound tenderness.  Lymphadenopathy:       Head (right side): No submandibular, no posterior auricular and no occipital adenopathy  present.       Head (left side): No submandibular, no posterior auricular and no occipital adenopathy present.    She has no cervical adenopathy.    She has no axillary adenopathy.       Right: No inguinal adenopathy present.       Left: No inguinal adenopathy present.  Neurological: She is oriented to person, place, and time. She appears not lethargic.  Generalized weakness without focal asymmetry.  Skin: Skin is warm and dry. She is not diaphoretic. There is pallor.  No cutaneous lesions. No ecchymosis or  petechiae.     LABORATORY DATA:  I have personally reviewed the data as listed:  Peripheral blood smear: Severe anemia with normocytic normochromic red blood cells possible rouleaux formation, very rare schistocytes and possible micro-spherocytes. No nucleated red blood cells detected. White blood cells are severely decreased in numbers. Almost no mature neutrophils or bands appreciated. No immature forms are visible at this time. Platelets are severely decreased in number, no giant or enlarged platelets noted.  Admission on 09/27/2016  Component Date Value Ref Range Status  . Color, Urine 09/27/2016 YELLOW  YELLOW Final  . APPearance 09/27/2016 CLEAR  CLEAR Final  . Specific Gravity, Urine 09/27/2016 1.015  1.005 - 1.030 Final  . pH 09/27/2016 6.0  5.0 - 8.0 Final  . Glucose, UA 09/27/2016 NEGATIVE  NEGATIVE mg/dL Final  . Hgb urine dipstick 09/27/2016 NEGATIVE  NEGATIVE Final  . Bilirubin Urine 09/27/2016 NEGATIVE  NEGATIVE Final  . Ketones, ur 09/27/2016 5* NEGATIVE mg/dL Final  . Protein, ur 09/27/2016 NEGATIVE  NEGATIVE mg/dL Final  . Nitrite 09/27/2016 NEGATIVE  NEGATIVE Final  . Leukocytes, UA 09/27/2016 NEGATIVE  NEGATIVE Final  . RBC / HPF 09/27/2016 NONE SEEN  0 - 5 RBC/hpf Final  . WBC, UA 09/27/2016 0-5  0 - 5 WBC/hpf Final  . Bacteria, UA 09/27/2016 NONE SEEN  NONE SEEN Final  . Squamous Epithelial / LPF 09/27/2016 0-5* NONE SEEN Final  . Opiates 09/27/2016 NONE DETECTED  NONE DETECTED Final  . Cocaine 09/27/2016 NONE DETECTED  NONE DETECTED Final  . Benzodiazepines 09/27/2016 NONE DETECTED  NONE DETECTED Final  . Amphetamines 09/27/2016 NONE DETECTED  NONE DETECTED Final  . Tetrahydrocannabinol 09/27/2016 NONE DETECTED  NONE DETECTED Final  . Barbiturates 09/27/2016 NONE DETECTED  NONE DETECTED Final   Comment:        DRUG SCREEN FOR MEDICAL PURPOSES ONLY.  IF CONFIRMATION IS NEEDED FOR ANY PURPOSE, NOTIFY LAB WITHIN 5 DAYS.        LOWEST DETECTABLE LIMITS FOR  URINE DRUG SCREEN Drug Class       Cutoff (ng/mL) Amphetamine      1000 Barbiturate      200 Benzodiazepine   989 Tricyclics       211 Opiates          300 Cocaine          300 THC              50   . WBC 09/27/2016 1.5* 4.0 - 10.5 K/uL Final  . RBC 09/27/2016 1.03* 3.87 - 5.11 MIL/uL Final  . Hemoglobin 09/27/2016 3.2* 12.0 - 15.0 g/dL Final   Comment: CRITICAL RESULT CALLED TO, READ BACK BY AND VERIFIED WITH: KHALID, A. RN @0826  ON 8.6.18 BY Alicia VERIFIED VIA RECOLLECT   . HCT 09/27/2016 9.3* 36.0 - 46.0 % Final  . MCV 09/27/2016 90.3  78.0 - 100.0 fL Final  . [facility] 09/27/2016  31.1  26.0 - 34.0 pg Final  . MCHC 09/27/2016 34.4  30.0 - 36.0 g/dL Final  . RDW 09/27/2016 19.9* 11.5 - 15.5 % Final  . Platelets 09/27/2016 10* 150 - 400 K/uL Final   Comment: SPECIMEN CHECKED FOR CLOTS REPEATED TO VERIFY PLATELET COUNT CONFIRMED BY SMEAR CRITICAL RESULT CALLED TO, READ BACK BY AND VERIFIED WITH: KHALID, A. RN @0826  ON 8.6.18 BY [place] VERIFIED VIA RECOLLECT   . Sodium 09/27/2016 131* 135 - 145 mmol/L Final  . Potassium 09/27/2016 4.3  3.5 - 5.1 mmol/L Final  . Chloride 09/27/2016 97* 101 - 111 mmol/L Final  . CO2 09/27/2016 20* 22 - 32 mmol/L Final  . Glucose, Bld 09/27/2016 122* 65 - 99 mg/dL Final  . BUN 09/27/2016 22* 6 - 20 mg/dL Final  . Creatinine, Ser 09/27/2016 0.65  0.44 - 1.00 mg/dL Final  . Calcium 09/27/2016 8.3* 8.9 - 10.3 mg/dL Final  . Total Protein 09/27/2016 6.7  6.5 - 8.1 g/dL Final  . Albumin 09/27/2016 3.3* 3.5 - 5.0 g/dL Final  . AST 09/27/2016 36  15 - 41 U/L Final  . ALT 09/27/2016 16  14 - 54 U/L Final  . Alkaline Phosphatase 09/27/2016 52  38 - 126 U/L Final  . Total Bilirubin 09/27/2016 1.1  0.3 - 1.2 mg/dL Final  . GFR calc non Af Amer 09/27/2016 >60  >60 mL/min Final  . GFR calc Af Amer 09/27/2016 >60  >60 mL/min Final   Comment: (NOTE) The eGFR has been calculated using the CKD EPI equation. This calculation has not been  validated in all clinical situations. eGFR's persistently <60 mL/min signify possible Chronic Kidney Disease.   . Anion gap 09/27/2016 14  5 - 15 Final  . Fecal Occult Bld 09/27/2016 NEGATIVE  NEGATIVE Final  . WBC 09/27/2016 1.3* 4.0 - 10.5 K/uL Final   Comment: REPEATED TO VERIFY CRITICAL RESULT CALLED TO, READ BACK BY AND VERIFIED WITH: KHALID,A. RN @1000  ON 8.6.18 BY NMCCOY   . RBC 09/27/2016 <1.00* 3.87 - 5.11 MIL/uL Final  . Hemoglobin 09/27/2016 3.0* 12.0 - 15.0 g/dL Final   CRITICAL VALUE NOTED.  VALUE IS CONSISTENT WITH PREVIOUSLY REPORTED AND CALLED VALUE.  [place] Kitchen HCT 09/27/2016 8.8* 36.0 - 46.0 % Final  . MCV 09/27/2016 90.7  78.0 - 100.0 fL Final  . MCH 09/27/2016 30.9  26.0 - 34.0 pg Final  . MCHC 09/27/2016 34.1  30.0 - 36.0 g/dL Final  . RDW 09/27/2016 19.9* 11.5 - 15.5 % Final  . Platelets 09/27/2016 11* 150 - 400 K/uL Final   CRITICAL VALUE NOTED.  VALUE IS CONSISTENT WITH PREVIOUSLY REPORTED AND CALLED VALUE.  Marland Kitchen Neutrophils Relative % 09/27/2016 10  % Final  . Lymphocytes Relative 09/27/2016 82  % Final  . Monocytes Relative 09/27/2016 7  % Final  . Eosinophils Relative 09/27/2016 0  % Final  . Basophils Relative 09/27/2016 1  % Final  . Neutro Abs 09/27/2016 0.1* 1.7 - 7.7 K/uL Final  . Lymphs Abs 09/27/2016 1.1  0.7 - 4.0 K/uL Final  . Monocytes Absolute 09/27/2016 0.1  0.1 - 1.0 K/uL Final  . Eosinophils Absolute 09/27/2016 0.0  0.0 - 0.7 K/uL Final  . Basophils Absolute 09/27/2016 0.0  0.0 - 0.1 K/uL Final  . RBC Morphology 09/27/2016 POLYCHROMASIA PRESENT   Final  . ABO/RH(D) 09/27/2016 O POS   Final  . Antibody Screen 09/27/2016 NEG   Final  . Sample Expiration 09/27/2016 09/30/2016   Final  .  Unit Number 09/27/2016 [id number]   Final  . Blood Component Type 09/27/2016 RBC LR PHER2   Final  . Unit division 09/27/2016 00   Final  . Status of Unit 09/27/2016 ISSUED   Final  . Transfusion Status 09/27/2016 OK TO TRANSFUSE   Final  . Crossmatch Result  09/27/2016 Compatible   Final  . Unit Number 09/27/2016 [id number]   Final  . Blood Component Type 09/27/2016 RBC LR PHER2   Final  . Unit division 09/27/2016 00   Final  . Status of Unit 09/27/2016 ISSUED   Final  . Transfusion Status 09/27/2016 OK TO TRANSFUSE   Final  . Crossmatch Result 09/27/2016 Compatible   Final  . Unit Number 09/27/2016 [id number]   Final  . Blood Component Type 09/27/2016 RED CELLS,LR   Final  . Unit division 09/27/2016 00   Final  . Status of Unit 09/27/2016 ISSUED   Final  . Transfusion Status 09/27/2016 OK TO TRANSFUSE   Final  . Crossmatch Result 09/27/2016 Compatible   Final  . Unit Number 09/27/2016 [id number]   Final  . Blood Component Type 09/27/2016 RBC LR PHER1   Final  . Unit division 09/27/2016 00   Final  . Status of Unit 09/27/2016 ALLOCATED   Final  . Transfusion Status 09/27/2016 OK TO TRANSFUSE   Final  . Crossmatch Result 09/27/2016 Compatible   Final  . Order Confirmation 09/27/2016 ORDER PROCESSED BY BLOOD BANK   Final  . ISSUE DATE / TIME 09/27/2016 [id number]   Final  . Blood Product Unit Number 09/27/2016 [id number]   Final  . PRODUCT CODE 09/27/2016 [id number]   Final  . Unit Type and Rh 09/27/2016 5100   Final  . Blood Product Expiration Date 09/27/2016 [id number]   Final  . ISSUE DATE / TIME 09/27/2016 [id number]   Final  . Blood Product Unit Number 09/27/2016 [id number]   Final  . PRODUCT CODE 09/27/2016 [id number]   Final  . Unit Type and Rh 09/27/2016 5100   Final  . Blood Product Expiration Date 09/27/2016 [id number]   Final  . ISSUE DATE / TIME 09/27/2016 [id number]   Final  . Blood Product Unit Number 09/27/2016 [id number]   Final  . PRODUCT CODE 09/27/2016 [id number]   Final  . Unit Type and Rh 09/27/2016 5100   Final  . Blood Product Expiration Date 09/27/2016 [id number]   Final  . Blood Product Unit Number 09/27/2016 [id number]   Final  . Unit Type and Rh 09/27/2016 5100   Final  . Blood  Product Expiration Date 09/27/2016 [id number]   Final  . ABO/RH(D) 09/27/2016 O POS   Final  . Retic Ct Pct 09/27/2016 3.2* 0.4 - 3.1 % Final  . RBC. 09/27/2016 1.30* 3.87 - 5.11 MIL/uL Final  . Retic Count, Absolute 09/27/2016 41.6  19.0 - 186.0 K/uL Final  . Smear Review 09/27/2016 SMEAR STAINED AND AVAILABLE FOR REVIEW   Final  . Prothrombin Time 09/27/2016 16.5* 11.4 - 15.2 seconds Final  . INR 09/27/2016 1.32   Final  . aPTT 09/27/2016 32  24 - 36 seconds Final  . Fibrinogen 09/27/2016 292  210 - 475 mg/dL Final  . D-Dimer, Quant 09/27/2016 1.44* 0.00 - 0.50 ug/mL-FEU Final   Comment: (NOTE) At the manufacturer cut-off of 0.50 ug/mL FEU, this assay has been documented to exclude PE with a sensitivity and negative predictive value of 97 to 99%.  At this time, this assay has not been approved by the FDA to exclude  DVT/VTE. Results should be correlated with clinical presentation.   . Platelets 09/27/2016 10* 150 - 400 K/uL Final   CRITICAL VALUE NOTED.  VALUE IS CONSISTENT WITH PREVIOUSLY REPORTED AND CALLED VALUE.  Marland Kitchen Smear Review 09/27/2016 SCHISTOCYTES NOTED ON SMEAR   Final  . Total Protein 09/27/2016 6.0* 6.5 - 8.1 g/dL Final  . Albumin 09/27/2016 2.9* 3.5 - 5.0 g/dL Final  . AST 09/27/2016 33  15 - 41 U/L Final  . ALT 09/27/2016 16  14 - 54 U/L Final  . Alkaline Phosphatase 09/27/2016 48  38 - 126 U/L Final  . Total Bilirubin 09/27/2016 1.1  0.3 - 1.2 mg/dL Final  . Bilirubin, Direct 09/27/2016 0.2  0.1 - 0.5 mg/dL Final  . Indirect Bilirubin 09/27/2016 0.9  0.3 - 0.9 mg/dL Final  . MRSA by PCR 09/27/2016 NEGATIVE  NEGATIVE Final   Comment:        The GeneXpert MRSA Assay (FDA approved for NASAL specimens only), is one component of a comprehensive MRSA colonization surveillance program. It is not intended to diagnose MRSA infection nor to guide or monitor treatment for MRSA infections.   Marland Kitchen LDH 09/27/2016 203* 98 - 192 U/L Final        Ardath Sax,  MD

## 2016-09-27 NOTE — ED Triage Notes (Signed)
Per EMS pt coming from home, with c/o shortness of breath x 3 years. Pt recently moved to [place] from [place] area. Per EMS pt's husband reports pt was diagnosed with Guillain-Barr in 2015 and has been c/o SOB ever since. Per EMS, pt's husband also reports pt has "massive abdominal hernia that is pressing on diaphragm" and is scheduled for surgery next week. EMS reports pt called them to take her to [place] where her surgeon is, however they didn'thave a clearance to do so. Per EMS, on their assessment pt was not short of breath, lungs sounds are clear, and pt's husband made a comment that pt should stay home, however pt told EMS: "you don't know me, I will just keep calling you back". Per EMS pt husband also reported that at baseline pt is confused and hallucinates.

## 2016-09-27 NOTE — Consult Note (Signed)
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 All her care previously at [facility]. Presents to the ER onn 8/6 w/ CC: estimated 3 weeks of progressive weakness, and worsening exertional dyspnea.  -on eval found to have severe pancytopenia. She was hemodynamically stable.  -no recent sick exposures -current medications reviewed-->no clear incidents of associated pancytopenia -PCCM asked to see to help determine level of care and triage ICU vs SDU setting as well as concern for clinical decline.   PAST MEDICAL HISTORY :  She  has a past medical history of Abdominal hernia; Anxiety; Depression; and Guillain Barr syndrome ([place]).  PAST SURGICAL HISTORY: She  has a past surgical history that includes Abdominal surgery.  Not on File  No current facility-administered medications on file prior to encounter.    No current outpatient prescriptions on file prior to encounter.    FAMILY HISTORY:  Her has no family status information on file.    SOCIAL HISTORY: She   is a non-smoker  Lives w/ husband Just moved to [place]:    Bolds are positive  Constitutional: weight loss (intentional w/ gastric sleeve and medications), gain, night sweats, Fevers, chills, fatigue .  HEENT: headaches, Sore throat, sneezing, nasal congestion, post nasal drip, Difficulty swallowing, Tooth/dental problems, visual complaints visual changes, ear ache CV:  chest pain, radiates,Orthopnea, PND, swelling in lower extremities, dizziness, palpitations, syncope.  GI  heartburn, indigestion, abdominal pain, nausea, vomiting, diarrhea,  change in bowel habits, loss of appetite, bloody stools.  Resp: cough, productive: , hemoptysis, dyspnea worse and progressive over last 3 weeks w/ exertion, chest pain, pleuritic.  Skin: rash or itching or icterus GU: dysuria, change in color of urine, urgency or frequency. flank pain, hematuria  MS: joint pain or swelling. decreased range of motion  Psych: change in mood or affect. depression or anxiety.  Neuro: difficulty with speech, weakness, numbness, ataxia   SUBJECTIVE:  No acute distress.   VITAL SIGNS: BP 114/71   Pulse 94   Temp 97.8 F (36.6 C)   Resp 17   Ht 5\' 4"  (1.626 m)   Wt 180 lb (81.6 kg)   SpO2 100%   BMI 30.90 kg/m   HEMODYNAMICS:    VENTILATOR SETTINGS:    INTAKE / OUTPUT: No intake/output data recorded.  PHYSICAL EXAMINATION: General appearance:  Pale 61 Year old  female, NAD, currently in acute distress Eyes: anicteric sclerae, moist conjunctivae; PERRL, EOMI bilaterally. Mouth:  Pale membranes  normal hard Neck: Trachea midline; neck supple, no JVD Lungs/chest: CTA, with normal respiratory effort and no intercostal retractions CV: RRR, no MRGs  Abdomen: Soft, non-tender; large abd hernia  Extremities: No peripheral edema or extremity lymphadenopathy Skin: Normal temperature, turgor and texture; no rash, ulcers or subcutaneous nodules Psych: Appropriate affect, alert and oriented to person, place and time   LABS:  BMET  Recent Labs Lab 09/27/16 0734  NA 131*  K 4.3  CL 97*  CO2 20*  BUN 22*  CREATININE 0.65  GLUCOSE 122*    Electrolytes  Recent Labs Lab 09/27/16 0734  CALCIUM 8.3*  CBC  Recent Labs Lab 09/27/16 0734 09/27/16 0845  WBC 1.5* 1.3*  HGB 3.2* 3.0*  HCT 9.3* 8.8*  PLT 10* 11*    Coag's No results for input(s): APTT, INR in the last 168 hours.  Sepsis Markers No results for input(s): LATICACIDVEN, PROCALCITON, O2SATVEN in the last 168 hours.  ABG No results for input(s): PHART, PCO2ART, PO2ART  in the last 168 hours.  Liver Enzymes  Recent Labs Lab 09/27/16 0734  AST 36  ALT 16  ALKPHOS 52  BILITOT 1.1  ALBUMIN 3.3*    Cardiac Enzymes No results for input(s): TROPONINI, PROBNP in the last 168 hours.  Glucose No results for input(s): GLUCAP in the last 168 hours.  Imaging Dg Chest 2 View  Result Date: 09/27/2016 CLINICAL DATA:  Initial evaluation for worsening shortness of breath over past few days. EXAM: CHEST  2 VIEW COMPARISON:  None available. FINDINGS: Cardiac silhouette partially obscured, but grossly within normal limits. Mediastinal silhouette normal. Aortic atherosclerosis. Lungs are hypoinflated. Linear left perihilar opacities most consistent with atelectasis. No focal infiltrates. No pulmonary edema or pleural effusion. No pneumothorax. Chronic compression deformity involving the upper lumbar spine, likely L1 level. No acute osseus abnormality. IMPRESSION: 1. Shallow lung inflation with left perihilar atelectasis. No other active cardiopulmonary disease. 2. Chronic compression deformity involving the L1 vertebral body with secondary accentuated thoracolumbar kyphosis. Electronically Signed   By: Jeannine Boga M.D.   On: 09/27/2016 06:54     STUDIES:  blood smear>>> DIC panel >>> general anemia panel>>> Viral screen >>> LFTs>>>  CULTURES: BCX2 8/6>>> Viral panel 8/6>>>  ANTIBIOTICS:   SIGNIFICANT EVENTS:   LINES/TUBES:   ASSESSMENT / PLAN:  Pancytopenia of unclear etiology w/ symptomatic anemia  Extensive MAR reviewed. No clear evidence from current medication list.  Other consideration:malignancy, bone marrow failure or suppression,malnutrition as a consequence from gastric sleeve, infection or some sort of autoimmune disorder.  hgb on admit: 3.2 PLT: 10 WBC 1.5  Plan:  Check coags Check blood smear Check DIC panel  Check general anemia panel Viral screen  LFTs Will need Heme referral as in-patient. Will likely need  BM-BX Transfuse PRBCs Goal > 7 PLTs if PLT < 10K or if < 50K and needs surgical procedure Admit to SDU setting  Dyspnea in setting of symptomatic anemia pcxr personally reviewed. 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 On pharmacological weight loss medication Plan Checking anemia panel (B12 folate specifically) Check pre-albumin  Supportive care  abd hernia  Plan Supportive care  Can be admitted to the SDU setting We will be available as needed. She is currently hemodynamically stable. She needs in-patient heme consult. Have initiated hematologic work-up as we wait. She is currently getting PRBCs.   Erick Colace ACNP-BC [facility] Pager # [phone] OR # [phone] if no answer

## 2016-09-27 NOTE — Care Management Note (Signed)
Case Management Note  Patient Details  Name: Morgan Juarez MRN: [id number] Date of Birth: 09-18-1955  Subjective/Objective:                   anemia hgb 3.2 and shortness of breath/pancytopenia.  Action/Plan: Date:  September 27, 2016 Chart reviewed for concurrent status and case management needs. Will continue to follow patient progress. Discharge Planning: following for needs Expected discharge date: [id number] Velva Harman, BSN, [place], [place]  Expected Discharge Date:                  Expected Discharge Plan:  Home/Self Care  In-House Referral:     Discharge planning Services  CM Consult  Post Acute Care Choice:    Choice offered to:     DME Arranged:    DME Agency:     HH Arranged:    HH Agency:     Status of Service:  In process, will continue to follow  If discussed at Long Length of Stay Meetings, dates discussed:    Additional Comments:  Leeroy Cha, RN 09/27/2016, 1:30 PM

## 2016-09-27 NOTE — ED Notes (Signed)
Unable to read istat chem 8. RN and PA Sophia have been made aware. CBC collected sent to lab

## 2016-09-27 NOTE — Progress Notes (Signed)
Pharmacy Antibiotic Note  Morgan Juarez is a 61 y.o. female with hx Guillain-Barr in 2015 and abdominal hernia (with plan for surgical repair next week) presented to the ED on 09/27/2016 with c/o SOB and weakness.  She was found to have pancytopenia.  Heme/onc consulted with plan for BM biopsy for further workup.  To start fluconazole, acyclovir and levaquin for broad empiric coverage.  Plan: - levaquin 750 mg IV q24h - fluconazole 400 mg IV q24h - acyclovir ~10 mg/kg/dose q8h (used ABW for BMI >30)  ____________________________  Height: 5\' 4"  (162.6 cm) Weight: 180 lb (81.6 kg) IBW/kg (Calculated) : 54.7  Temp (24hrs), Avg:97.9 F (36.6 C), Min:97.7 F (36.5 C), Max:98.4 F (36.9 C)   Recent Labs Lab 09/27/16 0734 09/27/16 0845  WBC 1.5* 1.3*  CREATININE 0.65  --     Estimated Creatinine Clearance: 75.4 mL/min (by C-G formula based on SCr of 0.65 mg/dL).    No Known Allergies   Thank you for allowing pharmacy to be a part of this patient's care.  Lynelle Doctor 09/27/2016 6:36 PM

## 2016-09-27 NOTE — ED Notes (Signed)
Received phone call from Ace, in lab who reports pt's blood results have been deleted. Ace sts he made a conclusion that blood collected was "watered down" by IV fluids because Hgb was 2.9. Informed the caller that labs were collected by straight stick. MD was notified.

## 2016-09-27 NOTE — ED Notes (Signed)
Bed: YJ49 Expected date:  Expected time:  Means of arrival:  Comments: 27f SOB

## 2016-09-27 NOTE — ED Notes (Signed)
Intensivist at the bedisde. They were made aware of pt's WBC results- 1.3

## 2016-09-28 ENCOUNTER — Encounter (HOSPITAL_COMMUNITY): Payer: Self-pay | Admitting: General Surgery

## 2016-09-28 DIAGNOSIS — E039 Hypothyroidism, unspecified: Secondary | ICD-10-CM

## 2016-09-28 LAB — COMPREHENSIVE METABOLIC PANEL
ALK PHOS: 53 U/L (ref 38–126)
ALT: 15 U/L (ref 14–54)
ANION GAP: 9 (ref 5–15)
AST: 28 U/L (ref 15–41)
Albumin: 2.9 g/dL — ABNORMAL LOW (ref 3.5–5.0)
BILIRUBIN TOTAL: 1.3 mg/dL — AB (ref 0.3–1.2)
BUN: 10 mg/dL (ref 6–20)
CALCIUM: 7.2 mg/dL — AB (ref 8.9–10.3)
CO2: 20 mmol/L — ABNORMAL LOW (ref 22–32)
Chloride: 103 mmol/L (ref 101–111)
Creatinine, Ser: 0.51 mg/dL (ref 0.44–1.00)
GLUCOSE: 115 mg/dL — AB (ref 65–99)
POTASSIUM: 3.7 mmol/L (ref 3.5–5.1)
Sodium: 132 mmol/L — ABNORMAL LOW (ref 135–145)
TOTAL PROTEIN: 6.1 g/dL — AB (ref 6.5–8.1)

## 2016-09-28 MED ORDER — SODIUM CHLORIDE 0.9 % IV SOLN
Freq: Once | INTRAVENOUS | Status: AC
  Administered 2016-09-28: 23:00:00 via INTRAVENOUS

## 2016-09-28 MED ORDER — ONDANSETRON HCL 4 MG/2ML IJ SOLN
4.0000 mg | Freq: Four times a day (QID) | INTRAMUSCULAR | Status: DC | PRN
  Administered 2016-09-28 – 2016-10-01 (×6): 4 mg via INTRAVENOUS
  Filled 2016-09-28 (×6): qty 2

## 2016-09-28 MED ORDER — SODIUM CHLORIDE 0.9 % IV SOLN: Freq: Once | INTRAVENOUS | Status: DC

## 2016-09-28 MED ORDER — SODIUM CHLORIDE 0.9% FLUSH
10.0000 mL | INTRAVENOUS | Status: DC | PRN
  Administered 2016-10-01: 10 mL
  Filled 2016-09-28: qty 40

## 2016-09-28 MED ORDER — SODIUM CHLORIDE 0.9% FLUSH
10.0000 mL | Freq: Two times a day (BID) | INTRAVENOUS | Status: DC
  Administered 2016-09-29 – 2016-10-04 (×9): 10 mL

## 2016-09-28 MED ORDER — CHLORHEXIDINE GLUCONATE CLOTH 2 % EX PADS
6.0000 | MEDICATED_PAD | Freq: Every day | CUTANEOUS | Status: DC
  Administered 2016-09-28 – 2016-10-04 (×6): 6 via TOPICAL

## 2016-09-28 NOTE — Consult Note (Signed)
IP PROGRESS NOTE  Subjective:   Patient reports feeling significantly better this morning compared to the admission time. Her breathing is easier, and she feels more alert and awake. Denies any new symptoms.  Objective: Vital signs in last 24 hours: Blood pressure 139/67, pulse 82, temperature (!) 97.3 F (36.3 C), resp. rate 16, height 5\' 4"  (1.626 m), weight 180 lb (81.6 kg), SpO2 100 %.  Intake/Output from previous day: 08/06 0701 - 08/07 0700 In: 1484.8 [I.V.:260.8; Blood:1224] Out: 1000 [Urine:600; Stool:400]  Physical Exam:  HEENT: Anicteric sclerae, pupils are equal and reactive to light, extraocular muscles intact. No new oral sores. Stable ulceration over the left upper lip. Lungs: Clear to auscultation bilaterally without crackles or wheezing. Cardiac: S1/S2, regular, no murmurs, rubs, gallops. Abdomen: Soft, nontender, asymmetric due to large ventral hernia. Extremities: Trace bilateral lower extremity edema. Neuro: No gross focal neurological deficits  Lab Results:  Recent Labs  09/27/16 0734 09/27/16 0845 09/27/16 1404  WBC 1.5* 1.3*  --   HGB 3.2* 3.0*  --   HCT 9.3* 8.8*  --   PLT 10* 11* 10*    BMET  Recent Labs  09/27/16 0734 09/28/16 1410  NA 131* 132*  K 4.3 3.7  CL 97* 103  CO2 20* 20*  GLUCOSE 122* 115*  BUN 22* 10  CREATININE 0.65 0.51  CALCIUM 8.3* 7.2*    Studies/Results: Dg Chest 2 View  Result Date: 09/27/2016 CLINICAL DATA:  Initial evaluation for worsening shortness of breath over past few days. EXAM: CHEST  2 VIEW COMPARISON:  None available. FINDINGS: Cardiac silhouette partially obscured, but grossly within normal limits. Mediastinal silhouette normal. Aortic atherosclerosis. Lungs are hypoinflated. Linear left perihilar opacities most consistent with atelectasis. No focal infiltrates. No pulmonary edema or pleural effusion. No pneumothorax. Chronic compression deformity involving the upper lumbar spine, likely L1 level. No acute  osseus abnormality. IMPRESSION: 1. Shallow lung inflation with left perihilar atelectasis. No other active cardiopulmonary disease. 2. Chronic compression deformity involving the L1 vertebral body with secondary accentuated thoracolumbar kyphosis. Electronically Signed   By: Jeannine Boga M.D.   On: 09/27/2016 06:54    Medications: I have reviewed the patient's current medications.  Assessment/Plan: **Pancytopenia: Patient with history of Guillain-Barr syndrome precipitated by influenza immunizations, presenting with severe pancytopenia of uncertain duration, presumably, 3 weeks preceded by what appears to be a bout of viral infection with lip blistering and ulceration, development of cervical lymphadenopathy followed by abdominal discomfort, worsening nausea, and diarrhea. Subsequently, rapid deterioration of performance status with fatigue, constipation, decreasing oral intake of fluids and food. On presentation, profound neutropenia with absolute neutrophil count 0.1, life-threatening critical anemia with hemoglobin of 3.0 and severe thrombocytopenia with platelets of 1.0. Since admission, patient has received 4 units of packed red blood cells and a single unit of platelets. Obtaining additional lab work has been complicated by poor venous access. Currently, patient is undergoing placement of PICC line will eyes our ability to deliver treatment and obtain additional lab work.  Differential here includes direct injury of the bone marrow by a viral pathogens or a tickborne illnesses although the patient is reasonably homebound and unlikely to be exposed to ticks easily, chemical injury to the bone marrow, post viral aplastic anemia, acute leukemia. At this time, peripheral blood smear demonstrates no profound schistocytosis. There is not a definite evidence of hemolysis on the labwork either with only minimal elevation of LDH, normal bilirubin. I do not believe that a microangiopathic hemolytic  process is  taking place at this time. Review of the medication list reveals only promethazine as a potential medication to cause agranulocytosis cytopenia. It leaves profound anemia unexplained. For me, acute leukemia, myelodysplastic syndrome and post viral aplastic anemia are highest in differential. The timeline, the latter is more likely in my opinion. Patient exhibits no symptoms or signs of recurrence of Guillain-Barr this point in time.  First priority of management are hematological support with blood and platelet transfusions with the goals of bring hemoglobin up to 7.0 and platelets over 15,000. Second priority would be prophylaxis of additional infections development due to the current permissive environment in the patient. Subsequently, urgent workup for possible severe underlying hematological disorder will need to be conducted. My recommendations will be listed below  Recommendations: --Transfuse pRBC to goal of Hgb 7.0; all products leukoreduced and irradiated --Transfuse Plt to keep Plt =>15, all products look reduced and irradiated --Fibrinogen Q12hrs -- keep fibrinogen over 200, and treat with cryoprecipitate if it drops below --Continue Levofloxacin, fluconazole and acyclovir for infection prophylaxis --Consult IR for BM Bx   Thank you for involving [place] in the care of this patient. Our service will continue following the patient on daily basis and we will update our recommendations as more information becomes available.   LOS: 1 day   Ardath Sax, MD   09/28/2016, 5:38 PM

## 2016-09-28 NOTE — Consult Note (Signed)
Chief Complaint: pancytopenia  Referring Physician: Dr. Grace Isaac  Supervising Physician: Arne Cleveland  Patient Status: [facility] - In-pt  HPI: Morgan Juarez is a 61 y.o. 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  She has had a bone marrow biopsy about 3 years ago to assist with the diagnosis of GBS.  She has had about 3 weeks of worsening weakness.  She presented to the ED and her hgb was found to be 3.9 and her platelets around 11.  Hematology has seen her and requested a bone marrow biopsy to help determine an etiology for her findings.    Past Medical History:  Past Medical History:  Diagnosis Date  . Abdominal hernia   . Anxiety   . B12 deficiency   . Depression   . Glucose intolerance   . Guillain Barr syndrome ([place])   . Hypothyroidism     Past Surgical History:  Past Surgical History:  Procedure Laterality Date  . ABDOMINAL SURGERY    . COLONOSCOPY    . KYPHOPLASTY  05/24/2015    Family History:  Family History  Problem Relation Age of Onset  . Intracerebral hemorrhage Mother   . CAD Father   . Skin cancer Father     Social History:  reports that she quit smoking about 3 years ago. Her smoking use included Cigarettes. She has never used smokeless tobacco. She reports that she does not drink alcohol or use drugs.  Allergies: No Known Allergies  Medications: Medications reviewed in epic  Please HPI for pertinent positives, otherwise complete 10 system ROS negative.  Mallampati Score: MD Evaluation Airway: WNL Heart: WNL Abdomen: Other (comments) Abdomen comments: large ventral hernia Chest/ Lungs: WNL ASA  Classification: 3 Mallampati/Airway Score: One  Physical Exam: BP (!) 147/85   Pulse 82   Temp 97.8 F (36.6 C) (Oral)   Resp 16   Ht 5' 4"  (1.626 m)   Wt 180 lb (81.6 kg)   SpO2 96%   BMI 30.90 kg/m  Body mass index is 30.9 kg/m. General: pleasant, obese white female  who is laying in bed in NAD HEENT: head is normocephalic, atraumatic.  Sclera are noninjected.  PERRL.  Ears and nose without any masses or lesions.  Mouth is pink and moist Heart: regular, rate, and rhythm.  Normal s1,s2. No obvious murmurs, gallops, or rubs noted.  Palpable radial pulses bilaterally Lungs: CTAB, no wheezes, rhonchi, or rales noted.  Respiratory effort nonlabored Abd: soft, NT, ND, +BS, no masses or organomegaly.  She has a large incisional/ventral hernia Psych: A&Ox3 with an appropriate affect.   Labs: Results for orders placed or performed during the hospital encounter of 09/27/16 (from the past 48 hour(s))  Prepare RBC     Status: None   Collection Time: 09/27/16  7:00 AM  Result Value Ref Range   Order Confirmation ORDER PROCESSED BY BLOOD BANK   ABO/Rh     Status: None   Collection Time: 09/27/16  7:00 AM  Result Value Ref Range   ABO/RH(D) O POS   CBC     Status: Abnormal   Collection Time: 09/27/16  7:34 AM  Result Value Ref Range   WBC 1.5 (L) 4.0 - 10.5 K/uL   RBC 1.03 (L) 3.87 - 5.11 MIL/uL   Hemoglobin 3.2 (LL) 12.0 - 15.0 g/dL    Comment: CRITICAL RESULT CALLED TO, READ BACK BY AND VERIFIED WITH: KHALID, A. RN @0826  ON  8.6.18 BY NMCCOY RESULTS VERIFIED VIA RECOLLECT    HCT 9.3 (L) 36.0 - 46.0 %   MCV 90.3 78.0 - 100.0 fL   MCH 31.1 26.0 - 34.0 pg   MCHC 34.4 30.0 - 36.0 g/dL   RDW 19.9 (H) 11.5 - 15.5 %   Platelets 10 (LL) 150 - 400 K/uL    Comment: SPECIMEN CHECKED FOR CLOTS REPEATED TO VERIFY PLATELET COUNT CONFIRMED BY SMEAR CRITICAL RESULT CALLED TO, READ BACK BY AND VERIFIED WITH: KHALID, A. RN @0826  ON 8.6.18 BY NMCCOY RESULTS VERIFIED VIA RECOLLECT   Comprehensive metabolic panel     Status: Abnormal   Collection Time: 09/27/16  7:34 AM  Result Value Ref Range   Sodium 131 (L) 135 - 145 mmol/L   Potassium 4.3 3.5 - 5.1 mmol/L   Chloride 97 (L) 101 - 111 mmol/L   CO2 20 (L) 22 - 32 mmol/L   Glucose, Bld 122 (H) 65 - 99 mg/dL   BUN  22 (H) 6 - 20 mg/dL   Creatinine, Ser 0.65 0.44 - 1.00 mg/dL   Calcium 8.3 (L) 8.9 - 10.3 mg/dL   Total Protein 6.7 6.5 - 8.1 g/dL   Albumin 3.3 (L) 3.5 - 5.0 g/dL   AST 36 15 - 41 U/L   ALT 16 14 - 54 U/L   Alkaline Phosphatase 52 38 - 126 U/L   Total Bilirubin 1.1 0.3 - 1.2 mg/dL   GFR calc non Af Amer >60 >60 mL/min   GFR calc Af Amer >60 >60 mL/min    Comment: (NOTE) The eGFR has been calculated using the CKD EPI equation. This calculation has not been validated in all clinical situations. eGFR's persistently <60 mL/min signify possible Chronic Kidney Disease.    Anion gap 14 5 - 15  Type and screen     Status: None   Collection Time: 09/27/16  8:22 AM  Result Value Ref Range   ABO/RH(D) O POS    Antibody Screen NEG    Sample Expiration 09/30/2016    Unit Number [id number]    Blood Component Type RBC LR PHER2    Unit division 00    Status of Unit ISSUED,FINAL    Transfusion Status OK TO TRANSFUSE    Crossmatch Result Compatible    Unit Number [id number]    Blood Component Type RBC LR PHER2    Unit division 00    Status of Unit ISSUED,FINAL    Transfusion Status OK TO TRANSFUSE    Crossmatch Result Compatible    Unit Number [id number]    Blood Component Type RED CELLS,LR    Unit division 00    Status of Unit ISSUED,FINAL    Transfusion Status OK TO TRANSFUSE    Crossmatch Result Compatible    Unit Number [id number]    Blood Component Type RBC LR PHER1    Unit division 00    Status of Unit ISSUED,FINAL    Transfusion Status OK TO TRANSFUSE    Crossmatch Result Compatible   Urinalysis, Routine w reflex microscopic     Status: Abnormal   Collection Time: 09/27/16  8:34 AM  Result Value Ref Range   Color, Urine YELLOW YELLOW   APPearance CLEAR CLEAR   Specific Gravity, Urine 1.015 1.005 - 1.030   pH 6.0 5.0 - 8.0   Glucose, UA NEGATIVE NEGATIVE mg/dL   Hgb urine dipstick NEGATIVE NEGATIVE   Bilirubin Urine NEGATIVE NEGATIVE   Ketones, ur 5 (A)  NEGATIVE mg/dL   Protein,  ur NEGATIVE NEGATIVE mg/dL   Nitrite NEGATIVE NEGATIVE   Leukocytes, UA NEGATIVE NEGATIVE   RBC / HPF NONE SEEN 0 - 5 RBC/hpf   WBC, UA 0-5 0 - 5 WBC/hpf   Bacteria, UA NONE SEEN NONE SEEN   Squamous Epithelial / LPF 0-5 (A) NONE SEEN  Rapid urine drug screen (hospital performed)     Status: None   Collection Time: 09/27/16  8:34 AM  Result Value Ref Range   Opiates NONE DETECTED NONE DETECTED   Cocaine NONE DETECTED NONE DETECTED   Benzodiazepines NONE DETECTED NONE DETECTED   Amphetamines NONE DETECTED NONE DETECTED   Tetrahydrocannabinol NONE DETECTED NONE DETECTED   Barbiturates NONE DETECTED NONE DETECTED    Comment:        DRUG SCREEN FOR MEDICAL PURPOSES ONLY.  IF CONFIRMATION IS NEEDED FOR ANY PURPOSE, NOTIFY LAB WITHIN 5 DAYS.        LOWEST DETECTABLE LIMITS FOR URINE DRUG SCREEN Drug Class       Cutoff (ng/mL) Amphetamine      1000 Barbiturate      200 Benzodiazepine   122 Tricyclics       482 Opiates          300 Cocaine          300 THC              50   POC occult blood, ED Provider will collect     Status: None   Collection Time: 09/27/16  8:36 AM  Result Value Ref Range   Fecal Occult Bld NEGATIVE NEGATIVE  CBC with Differential/Platelet     Status: Abnormal   Collection Time: 09/27/16  8:45 AM  Result Value Ref Range   WBC 1.3 (LL) 4.0 - 10.5 K/uL    Comment: REPEATED TO VERIFY CRITICAL RESULT CALLED TO, READ BACK BY AND VERIFIED WITH: KHALID,A. RN @1000  ON 8.6.18 BY NMCCOY    RBC <1.00 (L) 3.87 - 5.11 MIL/uL   Hemoglobin 3.0 (LL) 12.0 - 15.0 g/dL    Comment: CRITICAL VALUE NOTED.  VALUE IS CONSISTENT WITH PREVIOUSLY REPORTED AND CALLED VALUE.   HCT 8.8 (L) 36.0 - 46.0 %   MCV 90.7 78.0 - 100.0 fL   MCH 30.9 26.0 - 34.0 pg   MCHC 34.1 30.0 - 36.0 g/dL   RDW 19.9 (H) 11.5 - 15.5 %   Platelets 11 (LL) 150 - 400 K/uL    Comment: CRITICAL VALUE NOTED.  VALUE IS CONSISTENT WITH PREVIOUSLY REPORTED AND CALLED VALUE.    Neutrophils Relative % 10 %   Lymphocytes Relative 82 %   Monocytes Relative 7 %   Eosinophils Relative 0 %   Basophils Relative 1 %   Neutro Abs 0.1 (L) 1.7 - 7.7 K/uL   Lymphs Abs 1.1 0.7 - 4.0 K/uL   Monocytes Absolute 0.1 0.1 - 1.0 K/uL   Eosinophils Absolute 0.0 0.0 - 0.7 K/uL   Basophils Absolute 0.0 0.0 - 0.1 K/uL   RBC Morphology POLYCHROMASIA PRESENT   Folate     Status: Abnormal   Collection Time: 09/27/16 10:45 AM  Result Value Ref Range   Folate 4.4 (L) >5.9 ng/mL    Comment: Performed at [facility], [place] [address] [place], [place] [address]  Reticulocytes     Status: Abnormal   Collection Time: 09/27/16 10:45 AM  Result Value Ref Range   Retic Ct Pct 3.2 (H) 0.4 - 3.1 %   RBC. 1.30 (L) 3.87 -  5.11 MIL/uL   Retic Count, Absolute 41.6 19.0 - 186.0 K/uL  Hepatic function panel     Status: Abnormal   Collection Time: 09/27/16 10:45 AM  Result Value Ref Range   Total Protein 6.0 (L) 6.5 - 8.1 g/dL   Albumin 2.9 (L) 3.5 - 5.0 g/dL   AST 33 15 - 41 U/L   ALT 16 14 - 54 U/L   Alkaline Phosphatase 48 38 - 126 U/L   Total Bilirubin 1.1 0.3 - 1.2 mg/dL   Bilirubin, Direct 0.2 0.1 - 0.5 mg/dL   Indirect Bilirubin 0.9 0.3 - 0.9 mg/dL  MRSA PCR Screening     Status: None   Collection Time: 09/27/16 11:51 AM  Result Value Ref Range   MRSA by PCR NEGATIVE NEGATIVE    Comment:        The GeneXpert MRSA Assay (FDA approved for NASAL specimens only), is one component of a comprehensive MRSA colonization surveillance program. It is not intended to diagnose MRSA infection nor to guide or monitor treatment for MRSA infections.   Vitamin B12     Status: Abnormal   Collection Time: 09/27/16  1:29 PM  Result Value Ref Range   Vitamin B-12 939 (H) 180 - 914 pg/mL    Comment: (NOTE) This assay is not validated for testing neonatal or myeloproliferative syndrome specimens for Vitamin B12 levels. Performed at [facility], [place] [address], [place],  [place] [address]   Iron and TIBC     Status: Abnormal   Collection Time: 09/27/16  1:29 PM  Result Value Ref Range   Iron 255 (H) 28 - 170 ug/dL   TIBC 286 250 - 450 ug/dL   Saturation Ratios 89 (H) 10.4 - 31.8 %   UIBC 31 ug/dL    Comment: Performed at [facility], [place] [address], [place], [place] [address]  Ferritin     Status: Abnormal   Collection Time: 09/27/16  1:29 PM  Result Value Ref Range   Ferritin 359 (H) 11 - 307 ng/mL    Comment: Performed at [facility], [place] [address] [place], [place] [address]  Lactate dehydrogenase     Status: Abnormal   Collection Time: 09/27/16  1:30 PM  Result Value Ref Range   LDH 203 (H) 98 - 192 U/L  Save smear     Status: None   Collection Time: 09/27/16  2:04 PM  Result Value Ref Range   Smear Review SMEAR STAINED AND AVAILABLE FOR REVIEW   DIC (disseminated intravasc coag) panel     Status: Abnormal   Collection Time: 09/27/16  2:04 PM  Result Value Ref Range   Prothrombin Time 16.5 (H) 11.4 - 15.2 seconds   INR 1.32    aPTT 32 24 - 36 seconds   Fibrinogen 292 210 - 475 mg/dL   D-Dimer, Quant 1.44 (H) 0.00 - 0.50 ug/mL-FEU    Comment: (NOTE) At the manufacturer cut-off of 0.50 ug/mL FEU, this assay has been documented to exclude PE with a sensitivity and negative predictive value of 97 to 99%.  At this time, this assay has not been approved by the FDA to exclude DVT/VTE. Results should be correlated with clinical presentation.    Platelets 10 (LL) 150 - 400 K/uL    Comment: CRITICAL VALUE NOTED.  VALUE IS CONSISTENT WITH PREVIOUSLY REPORTED AND CALLED VALUE.   Smear Review SCHISTOCYTES NOTED ON SMEAR   Prepare Pheresed Platelets     Status:  None (Preliminary result)   Collection Time: 09/28/16  2:07 PM  Result Value Ref Range   Unit Number [id number]    Blood Component Type PLTP LR1 PAS    Unit division 00    Status of Unit ALLOCATED    Transfusion Status OK TO TRANSFUSE     Imaging: Dg Chest 2 View  Result  Date: 09/27/2016 CLINICAL DATA:  Initial evaluation for worsening shortness of breath over past few days. EXAM: CHEST  2 VIEW COMPARISON:  None available. FINDINGS: Cardiac silhouette partially obscured, but grossly within normal limits. Mediastinal silhouette normal. Aortic atherosclerosis. Lungs are hypoinflated. Linear left perihilar opacities most consistent with atelectasis. No focal infiltrates. No pulmonary edema or pleural effusion. No pneumothorax. Chronic compression deformity involving the upper lumbar spine, likely L1 level. No acute osseus abnormality. IMPRESSION: 1. Shallow lung inflation with left perihilar atelectasis. No other active cardiopulmonary disease. 2. Chronic compression deformity involving the L1 vertebral body with secondary accentuated thoracolumbar kyphosis. Electronically Signed   By: Jeannine Boga M.D.   On: 09/27/2016 06:54    Assessment/Plan 1. Pancytopenia  We will plan to proceed with a bone marrow biopsy on Thursday at 0800am.  She will be NPO p MN on Wednesday night.  We will recheck her labs and a PT/INR. Risks and benefits discussed with the patient including, but not limited to bleeding, infection, damage to adjacent structures or low yield requiring additional tests. All of the patient's questions were answered, patient is agreeable to proceed. Consent signed and in chart.  Thank you for this interesting consult.  I greatly enjoyed meeting Lajoyce Tamura and look forward to participating in their care.  A copy of this report was sent to the requesting provider on this date.  Electronically Signed: Henreitta Cea 09/28/2016, 4:23 PM   I spent a total of 40 Minutes    in face to face in clinical consultation, greater than 50% of which was counseling/coordinating care for pancytopenia

## 2016-09-28 NOTE — Progress Notes (Signed)
Peripherally Inserted Central Catheter/Midline Placement  The IV Nurse has discussed with the patient and/or persons authorized to consent for the patient, the purpose of this procedure and the potential benefits and risks involved with this procedure.  The benefits include less needle sticks, lab draws from the catheter, and the patient may be discharged home with the catheter. Risks include, but not limited to, infection, bleeding, blood clot (thrombus formation), and puncture of an artery; nerve damage and irregular heartbeat and possibility to perform a PICC exchange if needed/ordered by physician.  Alternatives to this procedure were also discussed.  Bard Power PICC patient education guide, fact sheet on infection prevention and patient information card has been provided to patient /or left at bedside.    PICC/Midline Placement Documentation  PICC Double Lumen 27/61/47 PICC Right Basilic 38 cm 0 cm (Active)  Indication for Insertion or Continuance of Line Limited venous access - need for IV therapy >5 days (PICC only) 09/28/2016  4:00 PM  Exposed Catheter (cm) 0 cm 09/28/2016  4:00 PM  Site Assessment Clean;Dry;Intact 09/28/2016  4:00 PM  Lumen #1 Status Flushed;Saline locked;Blood return noted 09/28/2016  4:00 PM  Lumen #2 Status Flushed;Saline locked;Blood return noted 09/28/2016  4:00 PM  Dressing Type Transparent;Securing device 09/28/2016  4:00 PM  Dressing Status Clean;Dry;Intact;Antimicrobial disc in place 09/28/2016  4:00 PM  Dressing Change Due 10/05/16 09/28/2016  4:00 PM       Frances Maywood 09/28/2016, 4:24 PM

## 2016-09-28 NOTE — Progress Notes (Addendum)
Patient ID: Morgan Juarez, female   DOB: February 20, 1956, 61 y.o.   MRN: [id number]  PROGRESS NOTE    Jarrett Chicoine  IRJ:[id number] DOB: 1955-06-06 DOA: 09/27/2016 PCP: System, Pcp Not In   Brief Narrative:  61 y.o. 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 She was found to have pancytopenia with hemoglobin of 3.2 and platelet of 10 and WBC 1.5. Patient was developed by hematology and also PCCM.   Assessment & Plan:   Active Problems:   Pancytopenia ([place])   Hyponatremia   Anemia requiring transfusions   Pancytopenia Questionable cause. Status post 4 units packed red cells transfusion Hematology consult appreciated. CBC this AM is pending due to poor access; patient might need PICC line IR consulted for Bone marrow biopsy Will order 1 unit of platelet transfusion  Hyponatremia Questionable cause; follow this AM labs  Hypothyroidism Follow TSH  Chronic ventral hernia - Reducible. No signs of up obstruction. Outpatient follow-up with surgery  DVT prophylaxis: SCDs Code Status:  Full Family Communication: None at bedside Disposition Plan: Depends on the clinical outcome  Consultants: Hematology and PCCM  Procedures: None  Antimicrobials: Levaquin, acyclovir, Diflucan as per oncology from 09/27/2016   Subjective: Patient seen and examined at bedside. She denies any overnight fever, nausea, vomiting or bleeding.  Objective: Vitals:   09/28/16 0400 09/28/16 0500 09/28/16 0600 09/28/16 0705  BP: (!) 157/78 (!) 151/85 (!) 147/85   Pulse: 84 81 82   Resp: _0 Temp: 98.6 F (37 C)   98.2 F (36.8 C)  TempSrc: Oral   Oral  SpO2: 95% 100% 96%   Weight:      Height:        Intake/Output Summary (Last 24 hours) at 09/28/16 3016 Last data filed at 09/28/16 0636  Gross per 24 hour  Intake          1484.83 ml  Output             1000 ml  Net            484.83 ml   Filed Weights   09/27/16 0625  Weight: 81.6 kg (180 lb)    Examination:  General exam: Appears Very pale but comfortable Respiratory system: Bilateral decreased breath sound at bases Cardiovascular system: S1 & S2 heard, rate controlled  Gastrointestinal system: Abdomen is nondistended, soft and nontender. Large reducible ventral hernia present with no tenderness. Normal bowel sounds heard. Extremities: No cyanosis, clubbing, edema    Data Reviewed: I have personally reviewed following labs and imaging studies  CBC:  Recent Labs Lab 09/27/16 0734 09/27/16 0845 09/27/16 1404  WBC 1.5* 1.3*  --   NEUTROABS  --  0.1*  --   HGB 3.2* 3.0*  --   HCT 9.3* 8.8*  --   MCV 90.3 90.7  --   PLT 10* 11* 10*   Basic Metabolic Panel:  Recent Labs Lab 09/27/16 0734  NA 131*  K 4.3  CL 97*  CO2 20*  GLUCOSE 122*  BUN 22*  CREATININE 0.65  CALCIUM 8.3*   GFR: Estimated Creatinine Clearance: 75.4 mL/min (by C-G formula based on SCr of 0.65 mg/dL). Liver Function Tests:  Recent Labs Lab 09/27/16 0734 09/27/16 1045  AST 36 33  ALT 16 16  ALKPHOS 52 48  BILITOT 1.1 1.1  PROT 6.7 6.0*  ALBUMIN 3.3* 2.9*  No results for input(s): LIPASE, AMYLASE in the last 168 hours. No results for input(s): AMMONIA in the last 168 hours. Coagulation Profile:  Recent Labs Lab 09/27/16 1404  INR 1.32   Cardiac Enzymes: No results for input(s): CKTOTAL, CKMB, CKMBINDEX, TROPONINI in the last 168 hours. BNP (last 3 results) No results for input(s): PROBNP in the last 8760 hours. HbA1C: No results for input(s): HGBA1C in the last 72 hours. CBG: No results for input(s): GLUCAP in the last 168 hours. Lipid Profile: No results for input(s): CHOL, HDL, LDLCALC, TRIG, CHOLHDL, LDLDIRECT in the last 72 hours. Thyroid Function Tests: No results for input(s): TSH, T4TOTAL, FREET4, T3FREE, THYROIDAB in the last 72 hours. Anemia Panel:  Recent Labs  09/27/16 1045  09/27/16 1329  VITAMINB12  --  939*  FOLATE 4.4*  --   FERRITIN  --  359*  TIBC  --  286  IRON  --  255*  RETICCTPCT 3.2*  --    Sepsis Labs: No results for input(s): PROCALCITON, LATICACIDVEN in the last 168 hours.  Recent Results (from the past 240 hour(s))  MRSA PCR Screening     Status: None   Collection Time: 09/27/16 11:51 AM  Result Value Ref Range Status   MRSA by PCR NEGATIVE NEGATIVE Final    Comment:        The GeneXpert MRSA Assay (FDA approved for NASAL specimens only), is one component of a comprehensive MRSA colonization surveillance program. It is not intended to diagnose MRSA infection nor to guide or monitor treatment for MRSA infections.          Radiology Studies: Dg Chest 2 View  Result Date: 09/27/2016 CLINICAL DATA:  Initial evaluation for worsening shortness of breath over past few days. EXAM: CHEST  2 VIEW COMPARISON:  None available. FINDINGS: Cardiac silhouette partially obscured, but grossly within normal limits. Mediastinal silhouette normal. Aortic atherosclerosis. Lungs are hypoinflated. Linear left perihilar opacities most consistent with atelectasis. No focal infiltrates. No pulmonary edema or pleural effusion. No pneumothorax. Chronic compression deformity involving the upper lumbar spine, likely L1 level. No acute osseus abnormality. IMPRESSION: 1. Shallow lung inflation with left perihilar atelectasis. No other active cardiopulmonary disease. 2. Chronic compression deformity involving the L1 vertebral body with secondary accentuated thoracolumbar kyphosis. Electronically Signed   By: Jeannine Boga M.D.   On: 09/27/2016 06:54        Scheduled Meds: . gabapentin  800 mg Oral BID  . sodium chloride flush  3 mL Intravenous Q12H   Continuous Infusions: . sodium chloride Stopped (09/27/16 1424)  . sodium chloride 250 mL (09/27/16 1425)  . acyclovir Stopped (09/28/16 0327)  . fluconazole (DIFLUCAN) IV    . levofloxacin (LEVAQUIN)  IV       LOS: 1 day        Aline August, MD [facility] Pager [phone]  If 7PM-7AM, please contact night-coverage [email or web address] Password [facility] 09/28/2016, 9:23 AM

## 2016-09-29 DIAGNOSIS — E871 Hypo-osmolality and hyponatremia: Secondary | ICD-10-CM

## 2016-09-29 DIAGNOSIS — E876 Hypokalemia: Secondary | ICD-10-CM

## 2016-09-29 DIAGNOSIS — E538 Deficiency of other specified B group vitamins: Secondary | ICD-10-CM

## 2016-09-29 LAB — CBC WITH DIFFERENTIAL/PLATELET
BASOS ABS: 0 10*3/uL (ref 0.0–0.1)
Basophils Relative: 0 %
EOS ABS: 0 10*3/uL (ref 0.0–0.7)
Eosinophils Relative: 1 %
HCT: 15.8 % — ABNORMAL LOW (ref 36.0–46.0)
Hemoglobin: 5.8 g/dL — CL (ref 12.0–15.0)
Lymphocytes Relative: 81 %
Lymphs Abs: 0.7 10*3/uL (ref 0.7–4.0)
MCH: 30.7 pg (ref 26.0–34.0)
MCHC: 36.7 g/dL — ABNORMAL HIGH (ref 30.0–36.0)
MCV: 83.6 fL (ref 78.0–100.0)
MONO ABS: 0.1 10*3/uL (ref 0.1–1.0)
Monocytes Relative: 11 %
NEUTROS PCT: 7 %
Neutro Abs: 0.1 10*3/uL — ABNORMAL LOW (ref 1.7–7.7)
Platelets: 19 10*3/uL — CL (ref 150–400)
RBC: 1.89 MIL/uL — AB (ref 3.87–5.11)
RDW: 16.5 % — AB (ref 11.5–15.5)
WBC: 0.9 10*3/uL — CL (ref 4.0–10.5)

## 2016-09-29 LAB — CBC
HCT: 18.5 % — ABNORMAL LOW (ref 36.0–46.0)
HEMOGLOBIN: 6.7 g/dL — AB (ref 12.0–15.0)
MCH: 30.5 pg (ref 26.0–34.0)
MCHC: 36.2 g/dL — AB (ref 30.0–36.0)
MCV: 84.1 fL (ref 78.0–100.0)
Platelets: <5 10*3/uL — CL (ref 150–400)
RBC: 2.2 MIL/uL — ABNORMAL LOW (ref 3.87–5.11)
RDW: 16.3 % — ABNORMAL HIGH (ref 11.5–15.5)
WBC: 0.8 10*3/uL — AB (ref 4.0–10.5)

## 2016-09-29 LAB — BPAM PLATELET PHERESIS
BLOOD PRODUCT EXPIRATION DATE: 201808102359
Blood Product Expiration Date: 201808092359
ISSUE DATE / TIME: 201808071629
ISSUE DATE / TIME: 201808072328
UNIT TYPE AND RH: 6200
Unit Type and Rh: 5100

## 2016-09-29 LAB — BASIC METABOLIC PANEL
ANION GAP: 10 (ref 5–15)
BUN: 9 mg/dL (ref 6–20)
CALCIUM: 6.5 mg/dL — AB (ref 8.9–10.3)
CO2: 19 mmol/L — ABNORMAL LOW (ref 22–32)
Chloride: 105 mmol/L (ref 101–111)
Creatinine, Ser: 0.45 mg/dL (ref 0.44–1.00)
GLUCOSE: 127 mg/dL — AB (ref 65–99)
Potassium: 3.1 mmol/L — ABNORMAL LOW (ref 3.5–5.1)
SODIUM: 134 mmol/L — AB (ref 135–145)

## 2016-09-29 LAB — PROTIME-INR
INR: 1.25
Prothrombin Time: 15.8 seconds — ABNORMAL HIGH (ref 11.4–15.2)

## 2016-09-29 LAB — PREPARE PLATELET PHERESIS
UNIT DIVISION: 0
UNIT DIVISION: 0

## 2016-09-29 LAB — HIV ANTIBODY (ROUTINE TESTING W REFLEX): HIV SCREEN 4TH GENERATION: NONREACTIVE

## 2016-09-29 LAB — MAGNESIUM: MAGNESIUM: 1.5 mg/dL — AB (ref 1.7–2.4)

## 2016-09-29 LAB — CORTISOL-AM, BLOOD: Cortisol - AM: 18.8 ug/dL (ref 6.7–22.6)

## 2016-09-29 LAB — PREPARE RBC (CROSSMATCH)

## 2016-09-29 MED ORDER — TOPIRAMATE 25 MG PO TABS
25.0000 mg | ORAL_TABLET | Freq: Two times a day (BID) | ORAL | Status: DC
  Administered 2016-09-30 – 2016-10-04 (×9): 25 mg via ORAL
  Filled 2016-09-29 (×9): qty 1

## 2016-09-29 MED ORDER — DULOXETINE HCL 30 MG PO CPEP
30.0000 mg | ORAL_CAPSULE | Freq: Every day | ORAL | Status: DC
  Administered 2016-09-30 – 2016-10-04 (×5): 30 mg via ORAL
  Filled 2016-09-29 (×5): qty 1

## 2016-09-29 MED ORDER — LEVOTHYROXINE SODIUM 50 MCG PO TABS
50.0000 ug | ORAL_TABLET | Freq: Every day | ORAL | Status: DC
  Administered 2016-09-30 – 2016-10-04 (×5): 50 ug via ORAL
  Filled 2016-09-29 (×5): qty 1

## 2016-09-29 MED ORDER — DULOXETINE HCL 60 MG PO CPEP
60.0000 mg | ORAL_CAPSULE | Freq: Every day | ORAL | Status: DC
  Administered 2016-09-30 – 2016-10-04 (×5): 60 mg via ORAL
  Filled 2016-09-29: qty 1
  Filled 2016-09-29: qty 2
  Filled 2016-09-29: qty 1
  Filled 2016-09-29 (×2): qty 2

## 2016-09-29 MED ORDER — POTASSIUM CHLORIDE CRYS ER 20 MEQ PO TBCR
40.0000 meq | EXTENDED_RELEASE_TABLET | Freq: Once | ORAL | Status: AC
  Administered 2016-09-29: 40 meq via ORAL
  Filled 2016-09-29: qty 2

## 2016-09-29 MED ORDER — DULOXETINE HCL 30 MG PO CPEP: 30.0000 mg | ORAL_CAPSULE | Freq: Every day | ORAL | Status: DC

## 2016-09-29 MED ORDER — CLONAZEPAM 0.5 MG PO TABS
0.5000 mg | ORAL_TABLET | Freq: Every evening | ORAL | Status: DC | PRN
  Administered 2016-10-02 – 2016-10-03 (×2): 0.5 mg via ORAL
  Filled 2016-09-29 (×2): qty 1

## 2016-09-29 MED ORDER — FOLIC ACID 5 MG/ML IJ SOLN
1.0000 mg | Freq: Every day | INTRAMUSCULAR | Status: DC
  Administered 2016-09-29 – 2016-10-01 (×3): 1 mg via INTRAVENOUS
  Filled 2016-09-29 (×4): qty 0.2

## 2016-09-29 MED ORDER — DULOXETINE HCL 30 MG PO CPEP: 60.0000 mg | ORAL_CAPSULE | Freq: Every day | ORAL | Status: DC

## 2016-09-29 MED ORDER — MAGNESIUM SULFATE 2 GM/50ML IV SOLN
2.0000 g | Freq: Once | INTRAVENOUS | Status: AC
Start: 2016-09-29 — End: 2016-09-29
  Administered 2016-09-29: 2 g via INTRAVENOUS
  Filled 2016-09-29: qty 50

## 2016-09-29 MED ORDER — GABAPENTIN 600 MG PO TABS: 300.0000 mg | ORAL_TABLET | Freq: Two times a day (BID) | ORAL | Status: DC

## 2016-09-29 MED ORDER — SODIUM CHLORIDE 0.9 % IV SOLN
1000.0000 mL | INTRAVENOUS | Status: DC
  Administered 2016-09-30 – 2016-10-02 (×4): 1000 mL via INTRAVENOUS

## 2016-09-29 MED ORDER — SODIUM CHLORIDE 0.9 % IV SOLN
Freq: Once | INTRAVENOUS | Status: AC
  Administered 2016-09-29: 10:00:00 via INTRAVENOUS

## 2016-09-29 NOTE — Consult Note (Signed)
IP PROGRESS NOTE  Subjective:   Patient remains tired, but no worse than yesterday. No new symptoms. Appears to feel more clear and heart had an more interactive. Denies any new cough, shortness of breath. No nausea, no significant abdominal pain right now, but still has not had a bowel movement, last bowel movement now is about a week ago. No dysuria.  Objective: Vital signs in last 24 hours: Blood pressure 114/64, pulse 95, temperature 98.3 F (36.8 C), temperature source Oral, resp. rate 14, height 5' 4"  (1.626 m), weight 180 lb (81.6 kg), SpO2 100 %.  Intake/Output from previous day: 08/07 0701 - 08/08 0700 In: 2463.2 [P.O.:240; I.V.:1120.2; Blood:527; IV Piggyback:576] Out: 200 [Urine:200]  Physical Exam:  HEENT: Anicteric sclerae, pupils are equal and reactive to light, extraocular muscles intact. Left upper lip ulcer is healing. No new ulcerations of the lips or mucosal surfaces. Lungs: Clear to auscultation bilaterally without crackles or wheezing. Cardiac: S1/S2, regular, no murmurs, rubs, gallops. Abdomen: Soft, nontender, asymmetric due to large ventral hernia. Extremities: Trace bilateral lower extremity edema. Neuro: No gross focal neurological deficits Skin: Some ecchymosis noted after IV insertion sites. No bruising otherwise. No active petechiae  Lab Results:  Recent Labs  09/28/16 1410 09/29/16 0549  WBC 0.8* 0.9*  HGB 6.7* 5.8*  HCT 18.5* 15.8*  PLT <5* 19*    BMET  Recent Labs  09/28/16 1410 09/29/16 0549  NA 132* 134*  K 3.7 3.1*  CL 103 105  CO2 20* 19*  GLUCOSE 115* 127*  BUN 10 9  CREATININE 0.51 0.45  CALCIUM 7.2* 6.5*    Studies/Results: No results found.  Medications: I have reviewed the patient's current medications.  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 Subsequently, rapid deterioration of performance status with fatigue, constipation, decreasing oral intake of fluids and food. On presentation, profound neutropenia with absolute neutrophil count 0.1, life-threatening critical anemia with hemoglobin of 3.0 and severe thrombocytopenia with platelets of 10. Since admission, patient has received 6 units of packed red blood cells and a single unit of platelets.   Differential here includes direct injury of the bone marrow by a viral pathogens or a tickborne illnesses although the patient is reasonably homebound and unlikely to be exposed to ticks easily, chemical injury to the bone marrow, post viral aplastic anemia, acute leukemia. At this time, peripheral blood smear demonstrates no profound schistocytosis. There is not a definite evidence of hemolysis on the labwork either with only minimal elevation of LDH, normal bilirubin. I do not believe that a microangiopathic hemolytic process is taking place at this time. For me, acute leukemia, myelodysplastic syndrome and post viral aplastic anemia are highest in differential. Considering the timeline, the latter is more likely in my opinion. Patient exhibits no symptoms or signs of recurrence of Guillain-Barr this point in time.  Patient is receiving appropriate transfusional support. She is receiving anti-fungal, antibacterial and antiviral prophylaxis. Bone marrow biopsy is scheduled for tomorrow.  Recommendations: --Transfuse pRBC to goal of Hgb 7.0; all products leukoreduced and irradiated --Transfuse Plt to keep Plt =>15, all products look reduced and irradiated --Fibrinogen Q12hrs -- keep fibrinogen over 200, and treat with cryoprecipitate if it drops below --Continue Levofloxacin, fluconazole and acyclovir for infection  prophylaxis --Awaiting bone marrow  biopsy results to discuss diagnosis and management with the patient.  Thank you for involving [place] in the care of this patient. Our service will continue following the patient on daily basis and we will update our recommendations as more information becomes available.   LOS: 2 days   Ardath Sax, MD   09/29/2016, 1:21 PM

## 2016-09-29 NOTE — Progress Notes (Signed)
Pt having complaints of tenderness and a hardened area at the [facility] site of right arm. Area is not warm to touch but it bruised. Pt has no complaints of pain at the PICC line site which is above the area of tenderness. Dr. Hal Hope notified. Orders obtained.

## 2016-09-29 NOTE — Progress Notes (Signed)
CRITICAL VALUE ALERT  Critical Value:  Hgb 5.8  Date & Time Notied:  648  Provider Notified: Dr. Hal Hope  Orders Received/Actions taken:

## 2016-09-29 NOTE — Progress Notes (Signed)
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 Patient was found to have pancytopenia with hemoglobin 3.2, platelet count 10, and obesity. 1.5. Patient was hospitalized. She is being seen by hematology/oncology.  Reason for Visit: Pancytopenia  Consultants: Hematology/oncology. PCCM  Procedures: Bone marrow biopsy is scheduled for 8/9  Antibiotics: Fluconazole, levofloxacin, and acyclovir.  Subjective/Interval History: Patient continues to have fatigue. Denies any chest pain, shortness of breath currently. No nausea, vomiting. Denies any diarrhea.  ROS: No headaches  Objective:  Vital Signs  Vitals:   09/29/16 0709 09/29/16 0800 09/29/16 1000 09/29/16 1030  BP:  128/76 107/70 132/77  Pulse:  90 (!) 101 (!) 101  Resp:  _0 Temp: 98.4 F (36.9 C)  99 F (37.2 C) 98.5 F (36.9 C)  TempSrc: Oral  Oral Oral  SpO2:  99% 97% 98%  Weight:      Height:        Intake/Output Summary (Last 24 hours) at 09/29/16 1245 Last data filed at 09/29/16 0945  Gross per 24 hour  Intake          2463.17 ml  Output              450 ml  Net          2013.17 ml   Filed Weights   09/27/16 0625  Weight: 81.6 kg (180 lb)    General appearance: alert, cooperative, appears stated age and no distress Resp: clear to auscultation bilaterally Cardio: regular rate and rhythm, S1, S2 normal, no murmur, click, rub or gallop GI: soft, non-tender; bowel sounds normal; no masses,  no organomegaly. Ventral hernia is appreciated. Extremities: extremities normal, atraumatic, no cyanosis or edema Neurologic: Some lower extremity weakness is due to  her prior history of GBS. No other deficits noted.  Lab Results:  Data Reviewed: I have personally reviewed following labs and imaging studies  CBC:  Recent Labs Lab 09/27/16 0734 09/27/16 0845 09/27/16 1404 09/28/16 1410 09/29/16 0549  WBC 1.5* 1.3*  --  0.8* 0.9*  NEUTROABS  --  0.1*  --   --  0.1*  HGB 3.2* 3.0*  --  6.7* 5.8*  HCT 9.3* 8.8*  --  18.5* 15.8*  MCV 90.3 90.7  --  84.1 83.6  PLT 10* 11* 10* <5* 19*    Basic Metabolic Panel:  Recent Labs Lab 09/27/16 0734 09/28/16 1410 09/29/16 0549  NA 131* 132* 134*  K 4.3 3.7 3.1*  CL 97* 103 105  CO2 20* 20* 19*  GLUCOSE 122* 115* 127*  BUN 22* 10 9  CREATININE 0.65 0.51 0.45  CALCIUM 8.3* 7.2* 6.5*  MG  --   --  1.5*    GFR: Estimated Creatinine Clearance: 75.4 mL/min (by C-G formula based on SCr of 0.45 mg/dL).  Liver Function Tests:  Recent Labs Lab 09/27/16 0734 09/27/16 1045 09/28/16 1410  AST 36 33 28  ALT _1 ALKPHOS 52 48 53  BILITOT 1.1 1.1 1.3*  PROT 6.7 6.0* 6.1*  ALBUMIN 3.3* 2.9* 2.9*     Coagulation Profile:  Recent Labs Lab 09/27/16 1404 09/29/16 0549  INR 1.32 1.25    Anemia Panel:  Recent Labs  09/27/16 1045 09/27/16 1329  VITAMINB12  --  939*  FOLATE 4.4*  --   FERRITIN  --  359*  TIBC  --  286  IRON  --  255*  RETICCTPCT 3.2*  --     Recent Results (from the past 240 hour(s))  MRSA PCR Screening     Status: None   Collection Time: 09/27/16 11:51 AM  Result Value Ref Range Status   MRSA by PCR NEGATIVE NEGATIVE Final    Comment:        The GeneXpert MRSA Assay (FDA approved for NASAL specimens only), is one component of a comprehensive MRSA colonization surveillance program. It is not intended to diagnose MRSA infection nor to guide or monitor treatment for MRSA infections.   Culture, blood (Routine X 2) w Reflex to ID Panel     Status: None (Preliminary result)   Collection Time: 09/27/16  2:04 PM  Result Value Ref Range Status   Specimen  Description BLOOD RIGHT ANTECUBITAL  Final   Special Requests AEROBIC BOTTLE ONLY Blood Culture adequate volume  Final   Culture   Final    NO GROWTH 2 DAYS Performed at [facility], [address] [place], [place] [address]    Report Status PENDING  Incomplete  Culture, blood (Routine X 2) w Reflex to ID Panel     Status: None (Preliminary result)   Collection Time: 09/27/16  2:04 PM  Result Value Ref Range Status   Specimen Description BLOOD RIGHT HAND  Final   Special Requests IN PEDIATRIC BOTTLE Blood Culture adequate volume  Final   Culture   Final    NO GROWTH 2 DAYS Performed at [facility], [place] [address] [place],  [place] [address]    Report Status PENDING  Incomplete      Radiology Studies: No results found.   Medications:  Scheduled: . Chlorhexidine Gluconate Cloth  6 each Topical Daily  . gabapentin  800 mg Oral BID  . sodium chloride flush  10-40 mL Intracatheter Q12H  . sodium chloride flush  3 mL Intravenous Q12H   Continuous: . sodium chloride 1,000 mL (09/29/16 0959)  . sodium chloride 250 mL (09/27/16 1425)  . sodium chloride    . acyclovir Stopped (09/29/16 0449)  . fluconazole (DIFLUCAN) IV Stopped (09/28/16 2000)  . levofloxacin (LEVAQUIN) IV Stopped (09/28/16 1941)   IFO:YDXAJO chloride, ondansetron (ZOFRAN) IV, sodium chloride flush, sodium chloride flush  Assessment/Plan:  Active Problems:   Pancytopenia (HCC)   Hyponatremia   Anemia requiring transfusions    Pancytopenia Patient has been transfused 4 units of PRBCs and 1 unit of platelets. Additional 2 units ordered today. Reason for her pancytopenia is not entirely clear. Hematology is following. Folic acid level noted to be low. This will be supplemented. Plan is for a bone marrow biopsy to be performed tomorrow. Patient is currently hemodynamically stable. No evidence for bleeding. Patient is on prophylactic antibiotics with levofloxacin, fluconazole and acyclovir.  Mild  hyponatremia/hypokalemia/hypomagnesemia Sodium level is stable. Replace potassium and magnesium  Questionable hypothyroidism. No home medication list noted. Check TSH.  History of for GBS with residual weakness and neuropathy. Continue gabapentin  DVT Prophylaxis: SCDs    Code Status: Full code  Family Communication: Discussed with the patient  Disposition Plan: Management as outlined above.    LOS: 2 days   [place] [facility] Pager [phone] 09/29/2016, 12:45 PM  If 7PM-7AM, please contact night-coverage at [email or web address], password [facility]

## 2016-09-30 ENCOUNTER — Inpatient Hospital Stay (HOSPITAL_COMMUNITY): Payer: BC Managed Care – PPO

## 2016-09-30 ENCOUNTER — Encounter (HOSPITAL_COMMUNITY): Payer: Self-pay | Admitting: Radiology

## 2016-09-30 ENCOUNTER — Other Ambulatory Visit: Payer: Self-pay | Admitting: Radiology

## 2016-09-30 DIAGNOSIS — M79609 Pain in unspecified limb: Secondary | ICD-10-CM

## 2016-09-30 DIAGNOSIS — M7989 Other specified soft tissue disorders: Secondary | ICD-10-CM

## 2016-09-30 LAB — CBC
HEMATOCRIT: 22.7 % — AB (ref 36.0–46.0)
Hemoglobin: 8.1 g/dL — ABNORMAL LOW (ref 12.0–15.0)
MCH: 29.9 pg (ref 26.0–34.0)
MCHC: 35.7 g/dL (ref 30.0–36.0)
MCV: 83.8 fL (ref 78.0–100.0)
Platelets: 12 10*3/uL — CL (ref 150–400)
RBC: 2.71 MIL/uL — ABNORMAL LOW (ref 3.87–5.11)
RDW: 16.1 % — AB (ref 11.5–15.5)
WBC: 0.8 10*3/uL — CL (ref 4.0–10.5)

## 2016-09-30 LAB — TYPE AND SCREEN
ABO/RH(D): O POS
Antibody Screen: NEGATIVE
UNIT DIVISION: 0
UNIT DIVISION: 0
Unit division: 0
Unit division: 0
Unit division: 0
Unit division: 0

## 2016-09-30 LAB — BPAM RBC
BLOOD PRODUCT EXPIRATION DATE: 201808302359
BLOOD PRODUCT EXPIRATION DATE: 201808302359
BLOOD PRODUCT EXPIRATION DATE: 201808302359
BLOOD PRODUCT EXPIRATION DATE: 201808312359
Blood Product Expiration Date: 201808302359
Blood Product Expiration Date: 201808312359
ISSUE DATE / TIME: 201808060924
ISSUE DATE / TIME: 201808081001
ISSUE DATE / TIME: 201808081420
ISSUE DATE / TIME: 201808061425
ISSUE DATE / TIME: 201808061650
ISSUE DATE / TIME: 201808062357
UNIT TYPE AND RH: 5100
UNIT TYPE AND RH: 5100
UNIT TYPE AND RH: 5100
Unit Type and Rh: 5100
Unit Type and Rh: 5100
Unit Type and Rh: 5100

## 2016-09-30 LAB — EPSTEIN-BARR VIRUS VCA, IGM: EBV VCA IgM: 55.4 U/mL — ABNORMAL HIGH (ref 0.0–35.9)

## 2016-09-30 LAB — LYME DISEASE, WESTERN BLOT
IGG P23 AB.: ABSENT
IGG P28 AB.: ABSENT
IGM P23 AB.: ABSENT
IGM P41 AB.: ABSENT
IgG P18 Ab.: ABSENT
IgG P30 Ab.: ABSENT
IgG P39 Ab.: ABSENT
IgG P41 Ab.: ABSENT
IgG P45 Ab.: ABSENT
IgG P58 Ab.: ABSENT
IgG P66 Ab.: ABSENT
IgG P93 Ab.: ABSENT
IgM P39 Ab.: ABSENT
LYME IGG WB: NEGATIVE
LYME IGM WB: NEGATIVE

## 2016-09-30 LAB — MAGNESIUM: MAGNESIUM: 1.6 mg/dL — AB (ref 1.7–2.4)

## 2016-09-30 LAB — COMPREHENSIVE METABOLIC PANEL
ALT: 16 U/L (ref 14–54)
ANION GAP: 8 (ref 5–15)
AST: 26 U/L (ref 15–41)
Albumin: 2.8 g/dL — ABNORMAL LOW (ref 3.5–5.0)
Alkaline Phosphatase: 60 U/L (ref 38–126)
BILIRUBIN TOTAL: 1.2 mg/dL (ref 0.3–1.2)
BUN: 8 mg/dL (ref 6–20)
CO2: 22 mmol/L (ref 22–32)
Calcium: 7.1 mg/dL — ABNORMAL LOW (ref 8.9–10.3)
Chloride: 106 mmol/L (ref 101–111)
Creatinine, Ser: 0.48 mg/dL (ref 0.44–1.00)
Glucose, Bld: 102 mg/dL — ABNORMAL HIGH (ref 65–99)
POTASSIUM: 3.9 mmol/L (ref 3.5–5.1)
Sodium: 136 mmol/L (ref 135–145)
TOTAL PROTEIN: 5.9 g/dL — AB (ref 6.5–8.1)

## 2016-09-30 LAB — PROTEIN ELECTROPHORESIS, SERUM
A/G RATIO SPE: 1.1 (ref 0.7–1.7)
ALBUMIN ELP: 2.8 g/dL — AB (ref 2.9–4.4)
Alpha-1-Globulin: 0.3 g/dL (ref 0.0–0.4)
Alpha-2-Globulin: 0.5 g/dL (ref 0.4–1.0)
BETA GLOBULIN: 0.8 g/dL (ref 0.7–1.3)
GAMMA GLOBULIN: 1 g/dL (ref 0.4–1.8)
GLOBULIN, TOTAL: 2.6 g/dL (ref 2.2–3.9)
TOTAL PROTEIN ELP: 5.4 g/dL — AB (ref 6.0–8.5)

## 2016-09-30 LAB — TSH: TSH: 6.109 u[IU]/mL — ABNORMAL HIGH (ref 0.350–4.500)

## 2016-09-30 LAB — HEMOGLOBIN AND HEMATOCRIT, BLOOD
HEMATOCRIT: 22.6 % — AB (ref 36.0–46.0)
HEMOGLOBIN: 8.1 g/dL — AB (ref 12.0–15.0)

## 2016-09-30 LAB — CMV IGM: CMV IgM: <30 AU/mL (ref 0.0–29.9)

## 2016-09-30 LAB — FIBRINOGEN: FIBRINOGEN: 397 mg/dL (ref 210–475)

## 2016-09-30 LAB — EPSTEIN-BARR VIRUS VCA, IGG: EBV VCA IgG: 228 U/mL — ABNORMAL HIGH (ref 0.0–17.9)

## 2016-09-30 MED ORDER — POLYETHYLENE GLYCOL 3350 17 G PO PACK
17.0000 g | PACK | Freq: Every day | ORAL | Status: DC | PRN
  Administered 2016-09-30: 17 g via ORAL
  Filled 2016-09-30: qty 1

## 2016-09-30 MED ORDER — MIDAZOLAM HCL 2 MG/2ML IJ SOLN
INTRAMUSCULAR | Status: AC | PRN
  Administered 2016-09-30 (×2): 1 mg via INTRAVENOUS

## 2016-09-30 MED ORDER — LIDOCAINE HCL 1 % IJ SOLN
INTRAMUSCULAR | Status: AC | PRN
  Administered 2016-09-30: 10 mL

## 2016-09-30 MED ORDER — MAGNESIUM SULFATE 2 GM/50ML IV SOLN
2.0000 g | Freq: Once | INTRAVENOUS | Status: AC
  Administered 2016-09-30: 2 g via INTRAVENOUS
  Filled 2016-09-30: qty 50

## 2016-09-30 MED ORDER — SODIUM CHLORIDE 0.9 % IV SOLN: Freq: Once | INTRAVENOUS | Status: DC

## 2016-09-30 MED ORDER — MIDAZOLAM HCL 2 MG/2ML IJ SOLN
INTRAMUSCULAR | Status: AC
  Filled 2016-09-30: qty 4

## 2016-09-30 MED ORDER — FENTANYL CITRATE (PF) 100 MCG/2ML IJ SOLN
INTRAMUSCULAR | Status: AC | PRN
  Administered 2016-09-30 (×3): 50 ug via INTRAVENOUS

## 2016-09-30 MED ORDER — FENTANYL CITRATE (PF) 100 MCG/2ML IJ SOLN
INTRAMUSCULAR | Status: AC
  Filled 2016-09-30: qty 4

## 2016-09-30 MED ORDER — ONDANSETRON HCL 4 MG/2ML IJ SOLN
INTRAMUSCULAR | Status: AC
  Administered 2016-09-30: 4 mg via INTRAVENOUS
  Filled 2016-09-30: qty 2

## 2016-09-30 NOTE — Care Management Note (Signed)
Case Management Note  Patient Details  Name: Morgan Juarez MRN: [id number] Date of Birth: Jul 09, 1955  Subjective/Objective:    Sepsis with fever wbc of 0.9/hgb 8.1/                Action/Plan: Date:  September 30, 2016 Chart reviewed for concurrent status and case management needs. Will continue to follow patient progress. Discharge Planning: following for needs Expected discharge date: [id number] Velva Harman, BSN, [place], [place]  Expected Discharge Date:                  Expected Discharge Plan:  Home/Self Care  In-House Referral:     Discharge planning Services  CM Consult  Post Acute Care Choice:    Choice offered to:     DME Arranged:    DME Agency:     HH Arranged:    HH Agency:     Status of Service:  In process, will continue to follow  If discussed at Long Length of Stay Meetings, dates discussed:    Additional Comments:  Leeroy Cha, RN 09/30/2016, 8:15 AM

## 2016-09-30 NOTE — Procedures (Signed)
  Procedure:   CT bone marrow biopsy R iliac Preprocedure diagnosis:  pancytopenia Postprocedure diagnosis:  same EBL:     minimal Complications:   none immediate  See full dictation in [employer].  Dillard Cannon MD Main # [phone] Pager  [phone]

## 2016-09-30 NOTE — Progress Notes (Signed)
*  Preliminary Results* Right upper extremity venous duplex completed. Study was technically difficult due to focal swelling distal to the antecubital fossa and PICC line bandaging. Right upper extremity is negative for deep and superficial vein thrombosis.  Venous flow visualized in a vein of the right medial forearm distal to the antecubital fossa with velocities of 94cm/s. Vein appears to be possibly dilated. Unable to identify vein due to technical limitations. Etiology is unknown.  09/30/2016 3:02 PM  Maudry Mayhew, BS, RVT, RDCS, RDMS

## 2016-09-30 NOTE — Progress Notes (Signed)
 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 Patient was found to have pancytopenia with hemoglobin 3.2, platelet count 10. Patient was hospitalized. Hematology/oncology was consulted.  Reason for Visit: Pancytopenia  Consultants: Hematology/oncology. PCCM  Procedures: Bone marrow biopsy 8/9  Antibiotics: Fluconazole, levofloxacin, and acyclovir.  Subjective/Interval History: Patient feels well. Overnight, she was noted to have an area of swelling in her right arm. Denies any significant pain in that area. Denies any other complaints such as shortness of breath or chest pain.   ROS: Denies any nausea or vomiting  Objective:  Vital Signs  Vitals:   09/30/16 0000 09/30/16 0200 09/30/16 0405 09/30/16 0600  BP: 135/75 (!) 147/85  (!) 154/77  Pulse: 78 79  89  Resp: 14 16  16  Temp:   98.4 F (36.9 C)   TempSrc:   Oral   SpO2: 98% 98%  100%  Weight:   85.4 kg (188 lb 4.4 oz)   Height:        Intake/Output Summary (Last 24 hours) at 09/30/16 0734 Last data filed at 09/30/16 0600  Gross per 24 hour  Intake           3332.5 ml  Output             3825 ml  Net           -492.5 ml   Filed Weights   09/27/16 0625 09/30/16 0405  Weight: 81.6 kg (180 lb) 85.4 kg (188 lb 4.4 oz)    General appearance: Awake, alert. In no distress Resp: Clear to auscultation bilaterally. No wheezing, rales or rhonchi Cardio: S1, S2 is normal, regular. No S3, S4. No rubs, murmurs or bruits GI: Abdomen is soft. Nontender, nondistended. Bowel sounds are present. No masses, organomegaly Extremities: Swelling is noted in the right antecubital  foci. It is below the PICC line insertion site. Bruised appearance. Slightly tender. No warmth. No erythema. Neurologic: Some lower extremity weakness is due to her prior history of GBS. No other deficits noted.  Lab Results:  Data Reviewed: I have personally reviewed following labs and imaging studies  CBC:  Recent Labs Lab 09/27/16 0734 09/27/16 0845 09/27/16 1404 09/28/16 1410 09/29/16 0549 09/29/16 1843 09/30/16 0430  WBC 1.5* 1.3*  --  0.8* 0.9*  --  0.8*  NEUTROABS  --  0.1*  --   --  0.1*  --   --   HGB 3.2* 3.0*  --  6.7* 5.8* 8.1* 8.1*  HCT 9.3* 8.8*  --  18.5* 15.8* 22.6* 22.7*  MCV 90.3 90.7  --  84.1 83.6  --  83.8  PLT 10* 11* 10* <5* 19*  --  12*    Basic Metabolic Panel:  Recent Labs Lab 09/27/16 0734 09/28/16 1410 09/29/16 0549 09/30/16 0430  NA 131* 132* 134* 136  K 4.3 3.7 3.1* 3.9  CL 97* 103 105 106  CO2 20* 20* 19* 22  GLUCOSE 122* 115* 127* 102*  BUN 22* 10 9 8  CREATININE 0.65 0.51 0.45 0.48  CALCIUM 8.3* 7.2* 6.5* 7.1*  MG  --   --  1.5* 1.6*    GFR: Estimated Creatinine Clearance: 77.1 mL/min (by C-G   formula based on SCr of 0.48 mg/dL).  Liver Function Tests:  Recent Labs Lab 09/27/16 0734 09/27/16 1045 09/28/16 1410 09/30/16 0430  AST 36 33 28 26  ALT _0 ALKPHOS 52 48 53 60  BILITOT 1.1 1.1 1.3* 1.2  PROT 6.7 6.0* 6.1* 5.9*  ALBUMIN 3.3* 2.9* 2.9* 2.8*     Coagulation Profile:  Recent Labs Lab 09/27/16 1404 09/29/16 0549  INR 1.32 1.25    Anemia Panel:  Recent Labs  09/27/16 1045 09/27/16 1329  VITAMINB12  --  939*  FOLATE 4.4*  --   FERRITIN  --  359*  TIBC  --  286  IRON  --  255*  RETICCTPCT 3.2*  --     Recent Results (from the past 240 hour(s))  MRSA PCR Screening     Status: None   Collection Time: 09/27/16 11:51 AM  Result Value Ref Range Status   MRSA by PCR NEGATIVE NEGATIVE Final    Comment:        The GeneXpert MRSA Assay (FDA approved for NASAL specimens only), is one  component of a comprehensive MRSA colonization surveillance program. It is not intended to diagnose MRSA infection nor to guide or monitor treatment for MRSA infections.   Culture, blood (Routine X 2) w Reflex to ID Panel     Status: None (Preliminary result)   Collection Time: 09/27/16  2:04 PM  Result Value Ref Range Status   Specimen Description BLOOD RIGHT ANTECUBITAL  Final   Special Requests AEROBIC BOTTLE ONLY Blood Culture adequate volume  Final   Culture   Final    NO GROWTH 2 DAYS Performed at [facility], [address], [place], [place] [address]    Report Status PENDING  Incomplete  Culture, blood (Routine X 2) w Reflex to ID Panel     Status: None (Preliminary result)   Collection Time: 09/27/16  2:04 PM  Result Value Ref Range Status   Specimen Description BLOOD RIGHT HAND  Final   Special Requests IN PEDIATRIC BOTTLE Blood Culture adequate volume  Final   Culture   Final    NO GROWTH 2 DAYS Performed at [facility], [place] [address], [place], [place] [address]    Report Status PENDING  Incomplete      Radiology Studies: No results found.   Medications:  Scheduled: . Chlorhexidine Gluconate Cloth  6 each Topical Daily  . DULoxetine  30 mg Oral Daily   And  . DULoxetine  60 mg Oral Daily  . folic acid  1 mg Intravenous Daily  . gabapentin  800 mg Oral BID  . levothyroxine  50 mcg Oral QAC breakfast  . sodium chloride flush  10-40 mL Intracatheter Q12H  . sodium chloride flush  3 mL Intravenous Q12H  . topiramate  25 mg Oral BID   Continuous: . sodium chloride 250 mL (09/27/16 1425)  . sodium chloride    . sodium chloride 1,000 mL (09/30/16 0030)  . sodium chloride    . acyclovir Stopped (09/30/16 0524)  . fluconazole (DIFLUCAN) IV Stopped (09/29/16 2115)  . levofloxacin [facility]) IV Stopped (09/29/16 2011)   WYO:VZCHYI chloride, clonazePAM, ondansetron (ZOFRAN) IV, sodium chloride flush, sodium chloride  flush  Assessment/Plan:  Active Problems:   Pancytopenia (HCC)   Hyponatremia   Anemia requiring transfusions    Pancytopenia Patient has been transfused 6 units of PRBCs and 1 unit of platelets. Additional one pack of platelets ordered today  for a platelet count of 12, as recommended by hematology. Fibrinogen level 397. Cortisol 18.8. Folic acid level was noted to be low and is being supplemented. Reason for her pancytopenia is not entirely clear. Patient underwent bone marrow biopsy this morning. Will wait on results. Patient is on prophylactic anti-infectious treatment with levofloxacin, fluconazole and acyclovir.   Swelling Right antecubital Area Probably due to a previous peripheral line. Doppler study is pending. No evidence for infection. Continue to monitor. Warm compresses may help.  Mild hyponatremia/hypokalemia/hypomagnesemia Sodium level has improved. Potassium was also normal. Magnesium remains low and will be supplemented again.   Questionable hypothyroidism. Levothyroxine. Not mentioned on her home medication list. TSH 6.10. Significance of this is unclear. We will check a free T4 level.  History of for GBS with residual weakness and neuropathy. Continue gabapentin.  DVT Prophylaxis: SCDs    Code Status: Full code  Family Communication: Discussed with the patient  Disposition Plan: Management as outlined above.    LOS: 3 days   ,  [facility] Pager [phone] 09/30/2016, 7:34 AM  If 7PM-7AM, please contact night-coverage at [email or web address], password [email or web address]   

## 2016-09-30 NOTE — Progress Notes (Signed)
Pharmacy Antibiotic Note  Morgan Juarez is a 61 y.o. female with hx Guillain-Barr in 2015 and abdominal hernia (with plan for surgical repair next week) presented to the ED on 09/27/2016 with c/o SOB and weakness.  She was found to have pancytopenia.  Heme/onc consulted with plan for BM biopsy for further workup.  To start fluconazole, acyclovir and levaquin for broad empiric coverage.  Day #4 anti-infectives Afebrile WBC low 0.8 SCr stable 0.48, CrCl 77 ml/min  Plan: - Continue  levaquin 750 mg IV q24h - Continue fluconazole 400 mg IV q24h - Continue acyclovir ~10 mg/kg/dose q8h (used ABW for BMI >30) - Follow up ability to transition to PO (defer to MD)  ____________________________  Height: 5\' 4"  (162.6 cm) Weight: 188 lb 4.4 oz (85.4 kg) IBW/kg (Calculated) : 54.7  Temp (24hrs), Avg:98.6 F (37 C), Min:98.3 F (36.8 C), Max:99.4 F (37.4 C)   Recent Labs Lab 09/27/16 0734 09/27/16 0845 09/28/16 1410 09/29/16 0549 09/30/16 0430  WBC 1.5* 1.3* 0.8* 0.9* 0.8*  CREATININE 0.65  --  0.51 0.45 0.48    Estimated Creatinine Clearance: 77.1 mL/min (by C-G formula based on SCr of 0.48 mg/dL).    No Known Allergies  Antimicrobials this admission:  8/6 acyclovir>> 8/6 fluconazole>> 8/6 LVQ>>  Dose adjustments this admission:   Microbiology results:  8/6 BCx x2: ngtd 8/6 MRSA PCR: neg 8/7 RSV: 8/7 Lyme disease western blot: 8/7 HIV Ab: 8/7 Ehrlichia Ab panel: 8/7 Epstein-Barr virus IgM: 8/7 CMV IgM:  Thank you for allowing pharmacy to be a part of this patient's care.  Peggyann Juba, PharmD, BCPS Pager: [phone] 09/30/2016 8:13 AM

## 2016-10-01 DIAGNOSIS — M7981 Nontraumatic hematoma of soft tissue: Secondary | ICD-10-CM

## 2016-10-01 LAB — BASIC METABOLIC PANEL
Anion gap: 7 (ref 5–15)
BUN: 8 mg/dL (ref 6–20)
CALCIUM: 6.3 mg/dL — AB (ref 8.9–10.3)
CO2: 21 mmol/L — AB (ref 22–32)
CREATININE: 0.41 mg/dL — AB (ref 0.44–1.00)
Chloride: 106 mmol/L (ref 101–111)
GFR calc non Af Amer: >60 mL/min (ref >60)
GLUCOSE: 96 mg/dL (ref 65–99)
Potassium: 3.3 mmol/L — ABNORMAL LOW (ref 3.5–5.1)
Sodium: 134 mmol/L — ABNORMAL LOW (ref 135–145)

## 2016-10-01 LAB — PREPARE PLATELET PHERESIS: UNIT DIVISION: 0

## 2016-10-01 LAB — HEMOGLOBIN AND HEMATOCRIT, BLOOD
HCT: 23.7 % — ABNORMAL LOW (ref 36.0–46.0)
Hemoglobin: 8.7 g/dL — ABNORMAL LOW (ref 12.0–15.0)

## 2016-10-01 LAB — CBC WITH DIFFERENTIAL/PLATELET
BASOS ABS: 0 10*3/uL (ref 0.0–0.1)
Basophils Relative: 0 %
EOS ABS: 0 10*3/uL (ref 0.0–0.7)
Eosinophils Relative: 0 %
HEMATOCRIT: 18.4 % — AB (ref 36.0–46.0)
Hemoglobin: 6.8 g/dL — CL (ref 12.0–15.0)
LYMPHS PCT: 87 %
Lymphs Abs: 0.6 10*3/uL — ABNORMAL LOW (ref 0.7–4.0)
MCH: 30.8 pg (ref 26.0–34.0)
MCHC: 37 g/dL — AB (ref 30.0–36.0)
MCV: 83.3 fL (ref 78.0–100.0)
MONO ABS: 0 10*3/uL — AB (ref 0.1–1.0)
Monocytes Relative: 8 %
NEUTROS ABS: 0 10*3/uL — AB (ref 1.7–7.7)
Neutrophils Relative %: 5 %
Platelets: 12 10*3/uL — CL (ref 150–400)
RBC: 2.21 MIL/uL — ABNORMAL LOW (ref 3.87–5.11)
RDW: 15.9 % — AB (ref 11.5–15.5)
WBC: 0.6 10*3/uL — CL (ref 4.0–10.5)

## 2016-10-01 LAB — EHRLICHIA ANTIBODY PANEL
E CHAFFEENSIS AB, IGG: NEGATIVE
E CHAFFEENSIS AB, IGM: NEGATIVE
E. Chaffeensis (HME) IgM Titer: NEGATIVE
E.Chaffeensis (HME) IgG: NEGATIVE

## 2016-10-01 LAB — FIBRINOGEN: FIBRINOGEN: 347 mg/dL (ref 210–475)

## 2016-10-01 LAB — BPAM PLATELET PHERESIS
BLOOD PRODUCT EXPIRATION DATE: 201808102359
ISSUE DATE / TIME: 201808091250
Unit Type and Rh: 6200

## 2016-10-01 LAB — T4, FREE: FREE T4: 0.75 ng/dL (ref 0.61–1.12)

## 2016-10-01 LAB — PREPARE RBC (CROSSMATCH)

## 2016-10-01 LAB — MAGNESIUM: Magnesium: 1.6 mg/dL — ABNORMAL LOW (ref 1.7–2.4)

## 2016-10-01 LAB — RSV(RESPIRATORY SYNCYTIAL VIRUS) AB, BLOOD

## 2016-10-01 MED ORDER — CALCIUM GLUCONATE 10 % IV SOLN
1.0000 g | Freq: Once | INTRAVENOUS | Status: AC
  Administered 2016-10-01: 1 g via INTRAVENOUS
  Filled 2016-10-01: qty 10

## 2016-10-01 MED ORDER — ACETAMINOPHEN 325 MG PO TABS: 650.0000 mg | ORAL_TABLET | Freq: Four times a day (QID) | ORAL | Status: DC | PRN

## 2016-10-01 MED ORDER — POTASSIUM CHLORIDE CRYS ER 20 MEQ PO TBCR
20.0000 meq | EXTENDED_RELEASE_TABLET | Freq: Once | ORAL | Status: AC
  Administered 2016-10-01: 20 meq via ORAL
  Filled 2016-10-01: qty 1

## 2016-10-01 MED ORDER — SODIUM CHLORIDE 0.9 % IV SOLN
Freq: Once | INTRAVENOUS | Status: AC
  Administered 2016-10-01: 10:00:00 via INTRAVENOUS

## 2016-10-01 MED ORDER — HYDROCODONE-ACETAMINOPHEN 5-325 MG PO TABS
1.0000 | ORAL_TABLET | ORAL | Status: DC | PRN
  Administered 2016-10-01 – 2016-10-03 (×6): 2 via ORAL
  Filled 2016-10-01 (×6): qty 2

## 2016-10-01 MED ORDER — MAGNESIUM SULFATE 2 GM/50ML IV SOLN
2.0000 g | Freq: Once | INTRAVENOUS | Status: AC
  Administered 2016-10-01: 2 g via INTRAVENOUS
  Filled 2016-10-01: qty 50

## 2016-10-01 MED ORDER — SULFAMETHOXAZOLE-TRIMETHOPRIM 800-160 MG PO TABS
1.0000 | ORAL_TABLET | ORAL | Status: DC
  Administered 2016-10-01 – 2016-10-04 (×2): 1 via ORAL
  Filled 2016-10-01 (×2): qty 1

## 2016-10-01 MED ORDER — POLYETHYLENE GLYCOL 3350 17 G PO PACK
17.0000 g | PACK | Freq: Two times a day (BID) | ORAL | Status: DC
  Administered 2016-10-01 – 2016-10-04 (×7): 17 g via ORAL
  Filled 2016-10-01 (×8): qty 1

## 2016-10-01 MED ORDER — CALCIUM CARBONATE 1250 (500 CA) MG PO TABS
1.0000 | ORAL_TABLET | Freq: Two times a day (BID) | ORAL | Status: DC
  Administered 2016-10-01 – 2016-10-02 (×2): 500 mg via ORAL
  Filled 2016-10-01 (×2): qty 1

## 2016-10-01 NOTE — Consult Note (Signed)
IP PROGRESS NOTE  Subjective: No significant changes since yesterday. Patient is feeling reasonably well without progression of soft tissue hematoma in the right upper extremity. No interval fever, chills or night sweats. Patient reports feeling reasonably well without worsening dyspnea, abdominal discomfort.  Objective: Vital signs in last 24 hours: Blood pressure 126/68, pulse (!) 104, temperature 98.1 F (36.7 C), temperature source Oral, resp. rate 18, height 5' 4"  (1.626 m), weight 188 lb 4.4 oz (85.4 kg), SpO2 97 %.  Intake/Output from previous day: 08/09 0701 - 08/10 0700 In: 3300.3 [P.O.:720; I.V.:1835; Blood:246; IV Piggyback:499.3] Out: 3250 [Urine:3250]  Physical Exam:  HEENT: Anicteric sclerae, pupils are equal and reactive to light, extraocular muscles intact. Left upper lip ulcer is healing. No new ulcerations of the lips or mucosal surfaces. Lungs: Clear to auscultation bilaterally without crackles or wheezing. Cardiac: S1/S2, regular, no murmurs, rubs, gallops. Abdomen: Soft, nontender, asymmetric due to large ventral hernia. Extremities: Trace bilateral lower extremity edema. Neuro: No gross focal neurological deficits Skin: Some ecchymosis noted after IV insertion sites. No bruising otherwise. No active petechiae  Portacath/PICC-without erythema  Lab Results:  Recent Labs  09/30/16 0430 10/01/16 0404  WBC 0.8* 0.6*  HGB 8.1* 6.8*  HCT 22.7* 18.4*  PLT 12* 12*    BMET  Recent Labs  09/30/16 0430 10/01/16 0404  NA 136 134*  K 3.9 3.3*  CL 106 106  CO2 22 21*  GLUCOSE 102* 96  BUN 8 8  CREATININE 0.48 0.41*  CALCIUM 7.1* 6.3*    No results found for: CEA1  Studies/Results: Ct Biopsy  Result Date: 09/30/2016 CLINICAL DATA:  Pancytopenia EXAM: CT GUIDED DEEP ILIAC BONE ASPIRATION AND CORE BIOPSY TECHNIQUE: Patient was placed supine on the CT gantry and limited axial scans through the pelvis were obtained. Appropriate skin entry site was  identified. Skin site was marked, prepped with chlorhexidine, draped in usual sterile fashion, and infiltrated locally with 1% lidocaine. Intravenous Fentanyl and Versed were administered as conscious sedation during continuous monitoring of the patient's level of consciousness and physiological / cardiorespiratory status by the radiology RN, with a total moderate sedation time of 10 minutes. Under CT fluoroscopic guidance an 11-gauge Cook trocar bone needle was advanced into the right iliac bone just lateral to the sacroiliac joint. Once needle tip position was confirmed, aspiration was attempted but there is no return x3. Therefore, 2 core samples were obtained. Post procedure scans show no hematoma or fracture. Patient tolerated procedure well. COMPLICATIONS: COMPLICATIONS none IMPRESSION: 1. Technically successful CT guided right iliac bone core  biopsy. Electronically Signed   By: Lucrezia Europe M.D.   On: 09/30/2016 10:36   Ct Bone Marrow Biopsy & Aspiration  Result Date: 09/30/2016 CLINICAL DATA:  Pancytopenia EXAM: CT GUIDED DEEP ILIAC BONE ASPIRATION AND CORE BIOPSY TECHNIQUE: Patient was placed supine on the CT gantry and limited axial scans through the pelvis were obtained. Appropriate skin entry site was identified. Skin site was marked, prepped with chlorhexidine, draped in usual sterile fashion, and infiltrated locally with 1% lidocaine. Intravenous Fentanyl and Versed were administered as conscious sedation during continuous monitoring of the patient's level of consciousness and physiological / cardiorespiratory status by the radiology RN, with a total moderate sedation time of 10 minutes. Under CT fluoroscopic guidance an 11-gauge Cook trocar bone needle was advanced into the right iliac bone just lateral to the sacroiliac joint. Once needle tip position was confirmed, aspiration was attempted but there is no return x3. Therefore, 2 core samples  were obtained. Post procedure scans show no hematoma or  fracture. Patient tolerated procedure well. COMPLICATIONS: COMPLICATIONS none IMPRESSION: 1. Technically successful CT guided right iliac bone core  biopsy. Electronically Signed   By: Lucrezia Europe M.D.   On: 09/30/2016 10:36    Medications: I have reviewed the patient's current medications.  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 Subsequently, rapid deterioration of performance status with fatigue, constipation, decreasing oral intake of fluids and food. On presentation, profound neutropenia with absolute neutrophil count 0.1, life-threatening critical anemia with hemoglobin of 3.0 and severe thrombocytopenia with platelets of 10. Patient continues to require daily transfusions to maintain hemoglobin above 7 and platelets above 15. 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 Final determination is pending at this time.   Recommendations: --Transfuse pRBC to goal of Hgb 7.0; all products leukoreduced and irradiated --Transfuse Plt to keep Plt =>15, all products look reduced and irradiated --Fibrinogen Q12hrs -- keep fibrinogen over 200, and treat with cryoprecipitate if it drops below --Continue Levofloxacin, fluconazole and acyclovir for infection prophylaxis --Once the bone marrow pathology report was finalized, I will contact [facility] bone marrow transplant service for possible transfer of the patient to a higher level of hematological care can receive both the specialized evaluation and treatment for her  underlying condition. I have discussed the suspected diagnosis with the patient and her husband and I will update as I learn more.  Thank you for involving [place] in the care of this patient. Our service will be available on an as-needed basis over the weekend..    LOS: 4 days   Ardath Sax, MD   10/01/2016, 5:12 PM

## 2016-10-01 NOTE — Progress Notes (Signed)
CRITICAL VALUE ALERT  Critical Value:  Ca+ 6.3 and Hgb 6.8  Date & Time Notied:  10/01/16 0530  Provider Notified: Traid  Orders Received/Actions taken: replacements ordered

## 2016-10-01 NOTE — Progress Notes (Addendum)
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 Patient was found to have pancytopenia with hemoglobin 3.2, platelet count 10. Patient was hospitalized. Hematology/oncology was consulted.  Reason for Visit: Pancytopenia  Consultants: Hematology/oncology. PCCM  Procedures:  Bone marrow biopsy 8/9  Multiple PRBC and platelet transfusions  Antibiotics: Fluconazole, levofloxacin, and acyclovir.  Subjective/Interval History: Patient is feeling well. Denies any bleeding. No shortness of breath or chest pain. No other complaints offered.    ROS: Denies any nausea or vomiting  Objective:  Vital Signs  Vitals:   10/01/16 0300 10/01/16 0400 10/01/16 0500 10/01/16 0600  BP:    118/70  Pulse: 95 89 86 84  Resp: _0 Temp:      TempSrc:      SpO2: 96% 100% 96% 97%  Weight:      Height:        Intake/Output Summary (Last 24 hours) at 10/01/16 0808 Last data filed at 10/01/16 0610  Gross per 24 hour  Intake           3300.3 ml  Output             2850 ml  Net            450.3 ml   Filed Weights   09/27/16 0625 09/30/16 0405  Weight: 81.6 kg (180 lb) 85.4 kg (188 lb 4.4 oz)    General appearance: Awake, alert. In no distress Resp: Clear to auscultation bilaterally. No wheezing, rales or rhonchi Cardio: S1, S2 is normal, regular. No S3, S4. No rubs, murmurs or bruits GI: Abdomen is soft. Nontender, nondistended. Bowel sounds are present. No masses, organomegaly Extremities: Swelling is noted in the right antecubital foci. It is below the PICC line insertion site. Bruised appearance. Slightly tender. No warmth. No  erythema. Neurologic: Some lower extremity weakness is due to her prior history of GBS. No other deficits noted.  Lab Results:  Data Reviewed: I have personally reviewed following labs and imaging studies  CBC:  Recent Labs Lab 09/27/16 0845 09/27/16 1404 09/28/16 1410 09/29/16 0549 09/29/16 1843 09/30/16 0430 10/01/16 0404  WBC 1.3*  --  0.8* 0.9*  --  0.8* 0.6*  NEUTROABS 0.1*  --   --  0.1*  --   --  0.0*  HGB 3.0*  --  6.7* 5.8* 8.1* 8.1* 6.8*  HCT 8.8*  --  18.5* 15.8* 22.6* 22.7* 18.4*  MCV 90.7  --  84.1 83.6  --  83.8 83.3  PLT 11* 10* <5* 19*  --  12* 12*    Basic Metabolic Panel:  Recent Labs Lab 09/27/16 0734 09/28/16 1410 09/29/16 0549 09/30/16 0430 10/01/16 0404  NA 131* 132* 134* 136 134*  K 4.3 3.7 3.1* 3.9 3.3*  CL 97* 103 105 106 106  CO2 20* 20* 19* 22 21*  GLUCOSE 122* 115* 127* 102* 96  BUN 22* _1 CREATININE 0.65 0.51 0.45 0.48 0.41*  CALCIUM 8.3* 7.2* 6.5* 7.1* 6.3*  MG  --   --  1.5* 1.6* 1.6*    GFR: Estimated Creatinine Clearance: 77.1 mL/min (A) (by C-G formula based on SCr of 0.41 mg/dL (L)).  Liver Function Tests:  Recent Labs Lab 09/27/16 0734 09/27/16 1045 09/28/16 1410 2016-10-04 0430  AST 36 33 28 26  ALT _0 ALKPHOS 52 48 53 60  BILITOT 1.1 1.1 1.3* 1.2  PROT 6.7 6.0* 6.1* 5.9*  ALBUMIN 3.3* 2.9* 2.9* 2.8*     Coagulation Profile:  Recent Labs Lab 09/27/16 1404 09/29/16 0549  INR 1.32 1.25     Recent Results (from the past 240 hour(s))  MRSA PCR Screening     Status: None   Collection Time: 09/27/16 11:51 AM  Result Value Ref Range Status   MRSA by PCR NEGATIVE NEGATIVE Final    Comment:        The GeneXpert MRSA Assay (FDA approved for NASAL specimens only), is one component of a comprehensive MRSA colonization surveillance program. It is not intended to diagnose MRSA infection nor to guide or monitor treatment for MRSA infections.   Culture, blood (Routine X 2) w Reflex to ID  Panel     Status: None (Preliminary result)   Collection Time: 09/27/16  2:04 PM  Result Value Ref Range Status   Specimen Description BLOOD RIGHT ANTECUBITAL  Final   Special Requests AEROBIC BOTTLE ONLY Blood Culture adequate volume  Final   Culture   Final    NO GROWTH 3 DAYS Performed at [facility], [address] [place], [place] [address]    Report Status PENDING  Incomplete  Culture, blood (Routine X 2) w Reflex to ID Panel     Status: None (Preliminary result)   Collection Time: 09/27/16  2:04 PM  Result Value Ref Range Status   Specimen Description BLOOD RIGHT HAND  Final   Special Requests IN PEDIATRIC BOTTLE Blood Culture adequate volume  Final   Culture   Final    NO GROWTH 3 DAYS Performed at [facility], [place] [address], [place], [place] [address]    Report Status PENDING  Incomplete      Radiology Studies: Ct Biopsy  Result Date: 04-Oct-2016 CLINICAL DATA:  Pancytopenia EXAM: CT GUIDED DEEP ILIAC BONE ASPIRATION AND CORE BIOPSY TECHNIQUE: Patient was placed supine on the CT gantry and limited axial scans through the pelvis were obtained. Appropriate skin entry site was identified. Skin site was marked, prepped with chlorhexidine, draped in usual sterile fashion, and infiltrated locally with 1% lidocaine. Intravenous Fentanyl and Versed were administered as conscious sedation during continuous monitoring of the patient's level of consciousness and physiological / cardiorespiratory status by the radiology RN, with a total moderate sedation time of 10 minutes. Under CT fluoroscopic guidance an 11-gauge Cook trocar bone needle was advanced into the right iliac bone just lateral to the sacroiliac joint. Once needle tip position was confirmed, aspiration was attempted but there is no return x3. Therefore, 2 core samples were obtained. Post procedure scans show no hematoma or fracture. Patient tolerated procedure well. COMPLICATIONS: COMPLICATIONS none IMPRESSION: 1.  Technically successful CT guided right iliac bone core  biopsy. Electronically Signed   By: Lucrezia Europe M.D.   On: 04-Oct-2016 10:36   Ct Bone Marrow Biopsy & Aspiration  Result Date: 10/04/2016 CLINICAL DATA:  Pancytopenia EXAM: CT GUIDED DEEP ILIAC BONE ASPIRATION AND CORE BIOPSY TECHNIQUE: Patient was placed supine on the CT gantry and limited axial scans through the pelvis were obtained. Appropriate skin entry site was identified. Skin site was marked, prepped with chlorhexidine, draped in usual sterile fashion, and infiltrated locally with 1% lidocaine. Intravenous Fentanyl  and Versed were administered as conscious sedation during continuous monitoring of the patient's level of consciousness and physiological / cardiorespiratory status by the radiology RN, with a total moderate sedation time of 10 minutes. Under CT fluoroscopic guidance an 11-gauge Cook trocar bone needle was advanced into the right iliac bone just lateral to the sacroiliac joint. Once needle tip position was confirmed, aspiration was attempted but there is no return x3. Therefore, 2 core samples were obtained. Post procedure scans show no hematoma or fracture. Patient tolerated procedure well. COMPLICATIONS: COMPLICATIONS none IMPRESSION: 1. Technically successful CT guided right iliac bone core  biopsy. Electronically Signed   By: Lucrezia Europe M.D.   On: 09/30/2016 10:36     Medications:  Scheduled: . Chlorhexidine Gluconate Cloth  6 each Topical Daily  . DULoxetine  30 mg Oral Daily   And  . DULoxetine  60 mg Oral Daily  . folic acid  1 mg Intravenous Daily  . gabapentin  800 mg Oral BID  . levothyroxine  50 mcg Oral QAC breakfast  . sodium chloride flush  10-40 mL Intracatheter Q12H  . sodium chloride flush  3 mL Intravenous Q12H  . topiramate  25 mg Oral BID   Continuous: . sodium chloride 250 mL (10/01/16 0600)  . sodium chloride    . sodium chloride 1,000 mL (10/01/16 0600)  . sodium chloride    . sodium chloride      . acyclovir Stopped (10/01/16 0500)  . fluconazole (DIFLUCAN) IV Stopped (09/30/16 1942)  . levofloxacin (LEVAQUIN) IV Stopped (09/30/16 1912)   LNL:GXQJJH chloride, acetaminophen, clonazePAM, HYDROcodone-acetaminophen, ondansetron (ZOFRAN) IV, polyethylene glycol, sodium chloride flush, sodium chloride flush  Assessment/Plan:  Active Problems:   Pancytopenia (HCC)   Hyponatremia   Anemia requiring transfusions    Pancytopenia Patient has been transfused 6 units of PRBCs and 2 packs of platelets. Additional one pack of platelets ordered today for a platelet count of 12. Additional unit of PRBC also ordered. Hematology recommends keeping hemoglobin greater than 7 and platelet count greater than 15. Fibrinogen level greater than 300. Cortisol 18.8. Folic acid level was noted to be low and is being supplemented. Reason for her pancytopenia is not entirely clear. Patient underwent bone marrow biopsy on 8/9. Results are pending. Hematology to address this further. Patient is on prophylactic anti-infectious treatment with levofloxacin, fluconazole and acyclovir.   Swelling Right antecubital Area Probably due to a previous peripheral line. No DVT identified on Doppler study. Continue to monitor. Warm compresses may help.  Mild hyponatremia/hypokalemia/hypomagnesemia/hypocalcemia Sodium level has been stable. To be given additional dose of potassium and magnesium today. Calcium level noted to be 6.3. Albumin was 2.8 yesterday. Corrected calcium is 7.26. Patient was given calcium gluconate. Start oral [place].  Questionable hypothyroidism. Levothyroxine is not mentioned on her home medication list. TSH 6.10. Significance of this is unclear. Free T4 level is normal at 0.75.  History of for GBS with residual weakness and neuropathy. Continue gabapentin.  DVT Prophylaxis: SCDs    Code Status: Full code  Family Communication: Discussed with the patient  Disposition Plan: Management as outlined  above. Start mobilizing.    LOS: 4 days   [facility] Pager [phone] 10/01/2016, 8:08 AM  If 7PM-7AM, please contact night-coverage at [email or web address], password [facility]

## 2016-10-02 LAB — BASIC METABOLIC PANEL
Anion gap: 9 (ref 5–15)
BUN: 7 mg/dL (ref 6–20)
CHLORIDE: 107 mmol/L (ref 101–111)
CO2: 19 mmol/L — ABNORMAL LOW (ref 22–32)
Calcium: 6.5 mg/dL — ABNORMAL LOW (ref 8.9–10.3)
Creatinine, Ser: 0.4 mg/dL — ABNORMAL LOW (ref 0.44–1.00)
GFR calc Af Amer: >60 mL/min (ref >60)
GFR calc non Af Amer: >60 mL/min (ref >60)
Glucose, Bld: 133 mg/dL — ABNORMAL HIGH (ref 65–99)
POTASSIUM: 3 mmol/L — AB (ref 3.5–5.1)
SODIUM: 135 mmol/L (ref 135–145)

## 2016-10-02 LAB — TYPE AND SCREEN
ABO/RH(D): O POS
Antibody Screen: NEGATIVE
Unit division: 0

## 2016-10-02 LAB — BPAM PLATELET PHERESIS
BLOOD PRODUCT EXPIRATION DATE: 201808122359
ISSUE DATE / TIME: 201808100919
Unit Type and Rh: 5100

## 2016-10-02 LAB — CULTURE, BLOOD (ROUTINE X 2)
CULTURE: NO GROWTH
Culture: NO GROWTH
Special Requests: ADEQUATE
Special Requests: ADEQUATE

## 2016-10-02 LAB — CBC WITH DIFFERENTIAL/PLATELET
Basophils Absolute: 0 10*3/uL (ref 0.0–0.1)
Basophils Relative: 2 %
EOS ABS: 0 10*3/uL (ref 0.0–0.7)
Eosinophils Relative: 0 %
HEMATOCRIT: 21 % — AB (ref 36.0–46.0)
HEMOGLOBIN: 7.6 g/dL — AB (ref 12.0–15.0)
LYMPHS ABS: 0.4 10*3/uL — AB (ref 0.7–4.0)
LYMPHS PCT: 92 %
MCH: 31 pg (ref 26.0–34.0)
MCHC: 36.2 g/dL — ABNORMAL HIGH (ref 30.0–36.0)
MCV: 85.7 fL (ref 78.0–100.0)
Monocytes Absolute: 0 10*3/uL — ABNORMAL LOW (ref 0.1–1.0)
Monocytes Relative: 4 %
NEUTROS ABS: 0 10*3/uL — AB (ref 1.7–7.7)
NEUTROS PCT: 2 %
Platelets: 15 10*3/uL — CL (ref 150–400)
RBC: 2.45 MIL/uL — AB (ref 3.87–5.11)
RDW: 15.8 % — ABNORMAL HIGH (ref 11.5–15.5)
WBC: 0.5 10*3/uL — AB (ref 4.0–10.5)

## 2016-10-02 LAB — BPAM RBC
Blood Product Expiration Date: 201809072359
ISSUE DATE / TIME: 201808101150
Unit Type and Rh: 5100

## 2016-10-02 LAB — PREPARE PLATELET PHERESIS: Unit division: 0

## 2016-10-02 LAB — FIBRINOGEN: FIBRINOGEN: 415 mg/dL (ref 210–475)

## 2016-10-02 LAB — MAGNESIUM: MAGNESIUM: 1.5 mg/dL — AB (ref 1.7–2.4)

## 2016-10-02 MED ORDER — MAGNESIUM SULFATE 2 GM/50ML IV SOLN
2.0000 g | Freq: Once | INTRAVENOUS | Status: AC
  Administered 2016-10-02: 2 g via INTRAVENOUS
  Filled 2016-10-02: qty 50

## 2016-10-02 MED ORDER — FLEET ENEMA 7-19 GM/118ML RE ENEM
1.0000 | ENEMA | Freq: Every day | RECTAL | Status: DC | PRN
  Administered 2016-10-02: 1 via RECTAL
  Filled 2016-10-02: qty 1

## 2016-10-02 MED ORDER — POTASSIUM CHLORIDE CRYS ER 20 MEQ PO TBCR
40.0000 meq | EXTENDED_RELEASE_TABLET | ORAL | Status: AC
  Administered 2016-10-02 (×2): 40 meq via ORAL
  Filled 2016-10-02 (×2): qty 2

## 2016-10-02 MED ORDER — CALCIUM CARBONATE 1250 (500 CA) MG PO TABS
1.0000 | ORAL_TABLET | Freq: Three times a day (TID) | ORAL | Status: DC
  Administered 2016-10-02 – 2016-10-04 (×7): 500 mg via ORAL
  Filled 2016-10-02 (×6): qty 1

## 2016-10-02 MED ORDER — FOLIC ACID 1 MG PO TABS
1.0000 mg | ORAL_TABLET | Freq: Every day | ORAL | Status: DC
  Administered 2016-10-02 – 2016-10-04 (×3): 1 mg via ORAL
  Filled 2016-10-02 (×3): qty 1
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
## 2016-10-02 NOTE — Progress Notes (Signed)
OT Cancellation Note  Patient Details Name: Morgan Juarez MRN: [id number] DOB: 02-04-1956   Cancelled Treatment:    Reason Eval/Treat Not Completed: Other (comment). Per Pt. Pt to transfer to [facility] Monday. Will defer Ot to next venue of care.   [place], OT/L  [phone] 10/02/2016 10/02/2016, 2:28 PM

## 2016-10-02 NOTE — Evaluation (Signed)
Physical Therapy Evaluation Patient Details Name: Morgan Juarez MRN: [id number] DOB: Apr 10, 1955 Today's Date: 10/02/2016   History of Present Illness  Pt admitted through ED 2* SOB and dx with Pancytopenia.  Pt with hx of GBS and Kyphoplasty 4/17  Clinical Impression  Pt admitted as above and presenting with functional mobility limitations 2* generalized weakness and mild ambulatory balance deficits.  Pt very motivated and plans eventual dc home but reports likely transfer to [facility] 10/04/16.   Follow Up Recommendations Other (comment) (TBD - pt reports probable transfer to [facility] Monday)    Equipment Recommendations  None recommended by PT    Recommendations for Other Services OT consult     Precautions / Restrictions Precautions Precautions: Fall Restrictions Weight Bearing Restrictions: No      Mobility  Bed Mobility Overal bed mobility: Modified Independent             General bed mobility comments: Pt to EOB unassisted  Transfers Overall transfer level: Needs assistance Equipment used: Rolling walker (2 wheeled) Transfers: Sit to/from [facility] Sit to Stand: Min assist Stand pivot transfers: Min assist       General transfer comment: cues for use of UEs to self assist.  Min assist for stability with sit<>stand and stand pvt bed to [facility]  Ambulation/Gait Ambulation/Gait assistance: Min assist Ambulation Distance (Feet): 400 Feet Assistive device: Rolling walker (2 wheeled) Gait Pattern/deviations: Step-through pattern;Decreased step length - right;Decreased step length - left;Shuffle;Trunk flexed     General Gait Details: cues for posture and position from RW and assist for RW management  Stairs            Wheelchair Mobility    Modified Rankin (Stroke Patients Only)       Balance                                             Pertinent Vitals/Pain Pain Assessment: No/denies pain    Home Living  Family/patient expects to be discharged to:: Private residence Living Arrangements: Spouse/significant other Available Help at Discharge: Family Type of Home: House Home Access: Stairs to enter Entrance Stairs-Rails: Right Entrance Stairs-Number of Steps: 4 Home Layout: One level Home Equipment: Environmental consultant - 2 wheels;Cane - single point      Prior Function Level of Independence: Independent               Hand Dominance        Extremity/Trunk Assessment   Upper Extremity Assessment Upper Extremity Assessment: Generalized weakness    Lower Extremity Assessment Lower Extremity Assessment: Generalized weakness    Cervical / Trunk Assessment Cervical / Trunk Assessment: Kyphotic  Communication   Communication: No difficulties  Cognition Arousal/Alertness: Awake/alert Behavior During Therapy: WFL for tasks assessed/performed Overall Cognitive Status: Within Functional Limits for tasks assessed                                        General Comments      Exercises     Assessment/Plan    PT Assessment Patient needs continued PT services  PT Problem List Decreased strength;Decreased activity tolerance;Decreased balance;Pain;Decreased knowledge of use of DME       PT Treatment Interventions DME instruction;Gait training;Stair training;Functional mobility training;Therapeutic activities;Therapeutic exercise;Patient/family education;Balance training  PT Goals (Current goals can be found in the Care Plan section)  Acute Rehab PT Goals Patient Stated Goal: Regain IND  PT Goal Formulation: With patient Time For Goal Achievement: 10/16/16 Potential to Achieve Goals: Good    Frequency Min 3X/week   Barriers to discharge        Co-evaluation               AM-PAC PT "6 Clicks" Daily Activity  Outcome Measure Difficulty turning over in bed (including adjusting bedclothes, sheets and blankets)?: A Little Difficulty moving from lying on back  to sitting on the side of the bed? : A Little Difficulty sitting down on and standing up from a chair with arms (e.g., wheelchair, bedside commode, etc,.)?: Total Help needed moving to and from a bed to chair (including a wheelchair)?: A Little Help needed walking in hospital room?: A Little Help needed climbing 3-5 steps with a railing? : A Little 6 Click Score: 16    End of Session Equipment Utilized During Treatment: Gait belt Activity Tolerance: Patient tolerated treatment well Patient left: in chair;with call bell/phone within reach;with family/visitor present;with nursing/sitter in room Nurse Communication: Mobility status PT Visit Diagnosis: Unsteadiness on feet (R26.81);Difficulty in walking, not elsewhere classified (R26.2)    Time: 4818-5631 PT Time Calculation (min) (ACUTE ONLY): 28 min   Charges:   PT Evaluation $PT Eval Low Complexity: 1 Low PT Treatments $Gait Training: 8-22 mins   PT G Codes:        Pg [phone]   Rosalynn Sergent 10/02/2016, 12:49 PM

## 2016-10-03 ENCOUNTER — Encounter (HOSPITAL_COMMUNITY): Payer: Self-pay

## 2016-10-03 LAB — MAGNESIUM: Magnesium: 1.9 mg/dL (ref 1.7–2.4)

## 2016-10-03 LAB — BASIC METABOLIC PANEL
Anion gap: 6 (ref 5–15)
BUN: 9 mg/dL (ref 6–20)
CALCIUM: 7.7 mg/dL — AB (ref 8.9–10.3)
CO2: 23 mmol/L (ref 22–32)
Chloride: 104 mmol/L (ref 101–111)
Creatinine, Ser: 0.4 mg/dL — ABNORMAL LOW (ref 0.44–1.00)
GFR calc Af Amer: >60 mL/min (ref >60)
GLUCOSE: 102 mg/dL — AB (ref 65–99)
POTASSIUM: 4.7 mmol/L (ref 3.5–5.1)
Sodium: 133 mmol/L — ABNORMAL LOW (ref 135–145)

## 2016-10-03 LAB — CBC WITH DIFFERENTIAL/PLATELET
BASOS PCT: 2 %
Basophils Absolute: 0 10*3/uL (ref 0.0–0.1)
EOS PCT: 0 %
Eosinophils Absolute: 0 10*3/uL (ref 0.0–0.7)
HCT: 22.6 % — ABNORMAL LOW (ref 36.0–46.0)
Hemoglobin: 8.1 g/dL — ABNORMAL LOW (ref 12.0–15.0)
LYMPHS ABS: 0.5 10*3/uL — AB (ref 0.7–4.0)
Lymphocytes Relative: 94 %
MCH: 30.8 pg (ref 26.0–34.0)
MCHC: 35.8 g/dL (ref 30.0–36.0)
MCV: 85.9 fL (ref 78.0–100.0)
MONO ABS: 0 10*3/uL — AB (ref 0.1–1.0)
Monocytes Relative: 2 %
NEUTROS ABS: 0 10*3/uL — AB (ref 1.7–7.7)
Neutrophils Relative %: 2 %
PLATELETS: 12 10*3/uL — AB (ref 150–400)
RBC: 2.63 MIL/uL — ABNORMAL LOW (ref 3.87–5.11)
RDW: 15.9 % — AB (ref 11.5–15.5)
WBC: 0.5 10*3/uL — CL (ref 4.0–10.5)

## 2016-10-03 LAB — FIBRINOGEN
FIBRINOGEN: 447 mg/dL (ref 210–475)
Fibrinogen: 473 mg/dL (ref 210–475)

## 2016-10-03 MED ORDER — SODIUM CHLORIDE 0.9 % IV SOLN
Freq: Once | INTRAVENOUS | Status: AC
  Administered 2016-10-03: 11:00:00 via INTRAVENOUS

## 2016-10-03 MED ORDER — SODIUM CHLORIDE 0.9 % IV SOLN
1000.0000 mL | INTRAVENOUS | Status: DC
  Administered 2016-10-03 – 2016-10-04 (×2): 1000 mL via INTRAVENOUS
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
## 2016-10-03 NOTE — Progress Notes (Addendum)
Pharmacy Antibiotic Note  Morgan Juarez is a 61 y.o. female with hx Guillain-Barr in 2015 and abdominal hernia (with plan for surgical repair next week) presented to the ED on 09/27/2016 with c/o SOB and weakness.  She was found to have pancytopenia.  Heme/onc consulted with plan for BM biopsy for further workup.  To start fluconazole, acyclovir and levaquin for broad empiric coverage.  Septra MWF also added by MD.  Day #7 anti-infectives WBC low 0.5, ANC 0.0 SCr stable  Plan: - Continue  levaquin 750 mg IV q24h - Continue fluconazole 400 mg IV q24h - Continue acyclovir ~10 mg/kg/dose q8h (used ABW for BMI >30) - Follow up ability to transition to PO (defer to MD)  ____________________________  Height: 5\' 4"  (162.6 cm) Weight: 188 lb 4.4 oz (85.4 kg) IBW/kg (Calculated) : 54.7  Temp (24hrs), Avg:98.6 F (37 C), Min:97.8 F (36.6 C), Max:100 F (37.8 C)   Recent Labs Lab 09/29/16 0549 09/30/16 0430 10/01/16 0404 10/02/16 0339 10/03/16 0503  WBC 0.9* 0.8* 0.6* 0.5* 0.5*  CREATININE 0.45 0.48 0.41* 0.40* 0.40*    Estimated Creatinine Clearance: 77.1 mL/min (A) (by C-G formula based on SCr of 0.4 mg/dL (L)).    No Known Allergies  Antimicrobials this admission:  8/6 acyclovir>> 8/6 fluconazole>> 8/6 LVQ>> 8/10 Septra DS >>  Dose adjustments this admission:   Microbiology results:  8/6 BCx x2: NGF 8/6 MRSA PCR: neg 8/7 RSV: abnormal 1:8 8/7 Lyme disease western blot: negative 8/7 HIV Ab: non-reactive 8/7 Ehrlichia Ab panel: negative 8/7 Epstein-Barr virus IgM: positive - 55.4 Units/ml (range 0-35.9) 8/7 Epstein-Barr virus IgG: positive - 228 units/ml (range 0-17.9) 8/7 CMV IgM: negative  Thank you for allowing pharmacy to be a part of this patient's care.  Hershal Coria, PharmD, BCPS Pager: [phone] 10/03/2016 2:33 PM

## 2016-10-04 DIAGNOSIS — C9100 Acute lymphoblastic leukemia not having achieved remission: Secondary | ICD-10-CM

## 2016-10-04 LAB — BPAM PLATELET PHERESIS
BLOOD PRODUCT EXPIRATION DATE: 201808132359
ISSUE DATE / TIME: 201808121346
UNIT TYPE AND RH: 5100

## 2016-10-04 LAB — CBC WITH DIFFERENTIAL/PLATELET
BASOS ABS: 0 10*3/uL (ref 0.0–0.1)
Basophils Relative: 2 %
EOS PCT: 0 %
Eosinophils Absolute: 0 10*3/uL (ref 0.0–0.7)
HEMATOCRIT: 21.4 % — AB (ref 36.0–46.0)
HEMOGLOBIN: 7.7 g/dL — AB (ref 12.0–15.0)
LYMPHS PCT: 94 %
Lymphs Abs: 0.6 10*3/uL — ABNORMAL LOW (ref 0.7–4.0)
MCH: 30.2 pg (ref 26.0–34.0)
MCHC: 36 g/dL (ref 30.0–36.0)
MCV: 83.9 fL (ref 78.0–100.0)
MONOS PCT: 2 %
Monocytes Absolute: 0 10*3/uL — ABNORMAL LOW (ref 0.1–1.0)
NEUTROS ABS: 0 10*3/uL — AB (ref 1.7–7.7)
Neutrophils Relative %: 2 %
Platelets: 15 10*3/uL — CL (ref 150–400)
RBC: 2.55 MIL/uL — AB (ref 3.87–5.11)
RDW: 15.9 % — ABNORMAL HIGH (ref 11.5–15.5)
WBC: 0.6 10*3/uL — CL (ref 4.0–10.5)

## 2016-10-04 LAB — BASIC METABOLIC PANEL
ANION GAP: 6 (ref 5–15)
BUN: 9 mg/dL (ref 6–20)
CHLORIDE: 105 mmol/L (ref 101–111)
CO2: 23 mmol/L (ref 22–32)
Calcium: 7.8 mg/dL — ABNORMAL LOW (ref 8.9–10.3)
Creatinine, Ser: 0.44 mg/dL (ref 0.44–1.00)
GFR calc non Af Amer: >60 mL/min (ref >60)
GLUCOSE: 100 mg/dL — AB (ref 65–99)
Potassium: 4.2 mmol/L (ref 3.5–5.1)
Sodium: 134 mmol/L — ABNORMAL LOW (ref 135–145)

## 2016-10-04 LAB — PREPARE PLATELET PHERESIS: Unit division: 0

## 2016-10-04 LAB — MAGNESIUM: Magnesium: 1.6 mg/dL — ABNORMAL LOW (ref 1.7–2.4)

## 2016-10-04 LAB — LACTATE DEHYDROGENASE: LDH: 126 U/L (ref 98–192)

## 2016-10-04 LAB — FIBRINOGEN: FIBRINOGEN: 478 mg/dL — AB (ref 210–475)

## 2016-10-04 LAB — URIC ACID: Uric Acid, Serum: 3.9 mg/dL (ref 2.3–6.6)

## 2016-10-04 MED ORDER — SODIUM CHLORIDE 0.9 % IV SOLN: 1000.0000 mL | INTRAVENOUS | 0 refills | Status: AC

## 2016-10-04 MED ORDER — POLYETHYLENE GLYCOL 3350 17 G PO PACK: 17.0000 g | PACK | Freq: Two times a day (BID) | ORAL | 0 refills | Status: DC

## 2016-10-04 MED ORDER — GABAPENTIN 400 MG PO CAPS: 800.0000 mg | ORAL_CAPSULE | Freq: Two times a day (BID) | ORAL | Status: AC

## 2016-10-04 MED ORDER — TOPIRAMATE 25 MG PO TABS: 25.0000 mg | ORAL_TABLET | Freq: Two times a day (BID) | ORAL | Status: DC

## 2016-10-04 MED ORDER — ACETAMINOPHEN 325 MG PO TABS: 650.0000 mg | ORAL_TABLET | Freq: Four times a day (QID) | ORAL | Status: AC | PRN

## 2016-10-04 MED ORDER — POTASSIUM CHLORIDE CRYS ER 20 MEQ PO TBCR: 40.0000 meq | EXTENDED_RELEASE_TABLET | Freq: Once | ORAL | Status: DC

## 2016-10-04 MED ORDER — CALCIUM CARBONATE 1250 (500 CA) MG PO TABS: 1.0000 | ORAL_TABLET | Freq: Three times a day (TID) | ORAL | Status: AC

## 2016-10-04 MED ORDER — CHLORHEXIDINE GLUCONATE CLOTH 2 % EX PADS: 6.0000 | MEDICATED_PAD | Freq: Every day | CUTANEOUS | Status: DC

## 2016-10-04 MED ORDER — FLEET ENEMA 7-19 GM/118ML RE ENEM: 1.0000 | ENEMA | Freq: Every day | RECTAL | 0 refills | Status: DC | PRN

## 2016-10-04 MED ORDER — CLONAZEPAM 0.5 MG PO TABS: 0.5000 mg | ORAL_TABLET | Freq: Every evening | ORAL | 0 refills | Status: DC | PRN

## 2016-10-04 MED ORDER — FOLIC ACID 1 MG PO TABS: 1.0000 mg | ORAL_TABLET | Freq: Every day | ORAL | Status: DC

## 2016-10-04 MED ORDER — SODIUM CHLORIDE 0.9 % IV SOLN: 250.0000 mL | INTRAVENOUS | 0 refills | Status: DC | PRN

## 2016-10-04 MED ORDER — LEVOFLOXACIN IN D5W 750 MG/150ML IV SOLN: 750.0000 mg | INTRAVENOUS | Status: DC

## 2016-10-04 MED ORDER — LEVOTHYROXINE SODIUM 50 MCG PO TABS: 50.0000 ug | ORAL_TABLET | Freq: Every day | ORAL | Status: DC

## 2016-10-04 MED ORDER — FLUCONAZOLE IN SODIUM CHLORIDE 400-0.9 MG/200ML-% IV SOLN: 400.0000 mg | INTRAVENOUS | Status: DC

## 2016-10-04 MED ORDER — HYDROCODONE-ACETAMINOPHEN 5-325 MG PO TABS: 1.0000 | ORAL_TABLET | ORAL | 0 refills | Status: DC | PRN

## 2016-10-04 MED ORDER — DEXTROSE 5 % IV SOLN: 10.0000 mg/kg | Freq: Three times a day (TID) | INTRAVENOUS | Status: DC

## 2016-10-04 MED ORDER — MAGNESIUM SULFATE 2 GM/50ML IV SOLN
2.0000 g | Freq: Once | INTRAVENOUS | Status: AC
  Administered 2016-10-04: 2 g via INTRAVENOUS
  Filled 2016-10-04: qty 50

## 2016-10-04 MED ORDER — ONDANSETRON HCL 4 MG/2ML IJ SOLN: 4.0000 mg | Freq: Four times a day (QID) | INTRAMUSCULAR | 0 refills | Status: AC | PRN

## 2016-10-04 MED ORDER — SODIUM CHLORIDE 0.9% FLUSH: 10.0000 mL | INTRAVENOUS | Status: AC | PRN

## 2016-10-04 MED ORDER — SODIUM CHLORIDE 0.9% FLUSH: 3.0000 mL | INTRAVENOUS | Status: AC | PRN

## 2016-10-04 MED ORDER — SODIUM CHLORIDE 0.9% FLUSH: 3.0000 mL | Freq: Two times a day (BID) | INTRAVENOUS | Status: AC

## 2016-10-04 MED ORDER — SODIUM CHLORIDE 0.9% FLUSH: 10.0000 mL | Freq: Two times a day (BID) | INTRAVENOUS | Status: DC

## 2016-10-04 MED ORDER — DULOXETINE HCL 60 MG PO CPEP: 60.0000 mg | ORAL_CAPSULE | Freq: Every day | ORAL | 3 refills | Status: AC

## 2016-10-04 MED ORDER — SULFAMETHOXAZOLE-TRIMETHOPRIM 800-160 MG PO TABS: 1.0000 | ORAL_TABLET | ORAL | Status: DC

## 2016-10-04 MED ORDER — DULOXETINE HCL 30 MG PO CPEP: 30.0000 mg | ORAL_CAPSULE | Freq: Every day | ORAL | 3 refills | Status: AC

## 2016-10-04 NOTE — Progress Notes (Signed)
Called report to [place], Therapist, sports at the [facility]. Will transfer the patient with the right upper arm double lumen picc in place, but I'm going to removed the Left forearm peripheral IV. Will contact [facility] to get transportation arranged.

## 2016-10-04 NOTE — Consult Note (Signed)
IP PROGRESS NOTE  Subjective: No significant changes over the weekend. Patient is feeling reasonably well without progression of soft tissue hematoma in the right upper extremity. No interval fever, chills or night sweats. Patient reports feeling reasonably well without worsening dyspnea, abdominal discomfort.  Objective: Vital signs in last 24 hours: Blood pressure 139/88, pulse 98, temperature 98.6 F (37 C), temperature source Oral, resp. rate 18, height _0  (1.626 m), weight 188 lb 4.4 oz (85.4 kg), SpO2 97 %.  Intake/Output from previous day: 08/12 0701 - 08/13 0700 In: 2596.7 [P.O.:480; I.V.:1168.3; Blood:146; IV Piggyback:802.4] Out: -   Physical Exam:  HEENT: Anicteric sclerae, pupils are equal and reactive to light, extraocular muscles intact. Left upper lip ulcer is healing. No new ulcerations of the lips or mucosal surfaces. Lungs: Clear to auscultation bilaterally without crackles or wheezing. Cardiac: S1/S2, regular, no murmurs, rubs, gallops. Abdomen: Soft, nontender, asymmetric due to large ventral hernia. Extremities: Trace bilateral lower extremity edema. Neuro: No gross focal neurological deficits Skin: Some ecchymosis noted after IV insertion sites. No bruising otherwise. No active petechiae  Portacath/PICC-without erythema  Lab Results:  Recent Labs  10/03/16 0503 10/04/16 0643  WBC 0.5* 0.6*  HGB 8.1* 7.7*  HCT 22.6* 21.4*  PLT 12* 15*    BMET  Recent Labs  10/03/16 0503 10/04/16 0643  NA 133* 134*  K 4.7 4.2  CL 104 105  CO2 23 23  GLUCOSE 102* 100*  BUN 9 9  CREATININE 0.40* 0.44  CALCIUM 7.7* 7.8*    No results found for: CEA1  Studies/Results: No results found.  Medications: I have reviewed the patient's current medications.  Assessment/Plan: **B-cell ALL: Patient initially presented with severe pancytopenia. Port while in the hospital. He is hemodynamically stable, subjectively improved with no new active issues over the  weekend.  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 Based on the disease and need for very specialized treatment and care, my recommendation to the patient is to [facility] ([place] I have contacted leukemia service at the facility and discussed the patient with Dr Linus Orn including past medical history, presentation, and Interval events. Dr. Linus Orn the patient for transfer for continued diagnostic workup and initiation of the treatment.   --Recommend transfer to [facility] for specialized leukemia management  Thank you for involving [place] in the care of this patient. I will be signing off at this point    LOS: 7 days   Ardath Sax, MD   10/04/2016, 12:01 PM

## 2016-10-04 NOTE — Progress Notes (Addendum)
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 Patient was found to have pancytopenia with hemoglobin 3.2, platelet count 10. Patient was hospitalized. Hematology/oncology was consulted.  Reason for Visit: Acute leukemia  Consultants: Hematology/oncology. PCCM  Procedures:  Bone marrow biopsy 8/9  Multiple PRBC and platelet transfusions  Antibiotics: Bactrim, Fluconazole, levofloxacin, and acyclovir.   Subjective/Interval History: Patient feels well. Denies any complaints. No bleeding episodes.  ROS: No chest pain or shortness of breath.  Objective:  Vital Signs  Vitals:   10/03/16 1415 10/03/16 1513 10/03/16 2159 10/04/16 0644  BP: 114/70 111/66 129/74 139/88  Pulse: (!) 101 98 (!) 110 98  Resp: _0 Temp: 97.8 F (36.6 C) 98.2 F (36.8 C) 98.5 F (36.9 C) 98.6 F (37 C)  TempSrc: Oral Oral Oral Oral  SpO2: 96% 99% 100% 97%  Weight:      Height:        Intake/Output Summary (Last 24 hours) at 10/04/16 0825 Last data filed at 10/04/16 0200  Gross per 24 hour  Intake          2483.63 ml  Output                0 ml  Net          2483.63 ml   Filed Weights   09/27/16 0625 09/30/16 0405  Weight: 81.6 kg (180 lb) 85.4 kg (188 lb 4.4 oz)    General appearance: Awake, alert. In no distress Resp: Clear to auscultation bilaterally. No wheezing, rales or rhonchi Cardio: S1, S2 is normal, regular. No rubs, murmurs or bruit GI: Abdomen is soft. Nontender, nondistended. Bowel sounds are present. No masses or organomegaly Extremities: Area of swelling over the right arm is stable.  Neurologic: Some lower  extremity weakness is due to her prior history of GBS. No other deficits noted.  Lab Results:  Data Reviewed: I have personally reviewed following labs and imaging studies  CBC:  Recent Labs Lab 09/29/16 0549  09/30/16 0430 10/01/16 0404 10/01/16 1727 10/02/16 0339 10/03/16 0503 10/04/16 0643  WBC 0.9*  --  0.8* 0.6*  --  0.5* 0.5* 0.6*  NEUTROABS 0.1*  --   --  0.0*  --  0.0* 0.0* 0.0*  HGB 5.8*  < > 8.1* 6.8* 8.7* 7.6* 8.1* 7.7*  HCT 15.8*  < > 22.7* 18.4* 23.7* 21.0* 22.6* 21.4*  MCV 83.6  --  83.8 83.3  --  85.7 85.9 83.9  PLT 19*  --  12* 12*  --  15* 12* 15*  < > = values in this interval not displayed.  Basic Metabolic Panel:  Recent Labs Lab 09/30/16 0430 10/01/16 0404 10/02/16 0339 10/03/16 0503 10/04/16 0643  NA 136 134* 135 133* 134*  K 3.9 3.3* 3.0* 4.7 4.2  CL 106 106 107 104 105  CO2 22 21* 19* 23 23  GLUCOSE 102* 96 133* 102* 100*  BUN _1 CREATININE 0.48 0.41* 0.40* 0.40* 0.44  CALCIUM 7.1* 6.3* 6.5* 7.7* 7.8*  MG 1.6* 1.6* 1.5* 1.9 1.6*    GFR: Estimated Creatinine Clearance: 77.1 mL/min (by C-G formula based on SCr of 0.44 mg/dL).  Liver Function Tests:  Recent Labs Lab 09/27/16 1045 09/28/16 1410 09/30/16 0430  AST 33 28 26  ALT _0 ALKPHOS 48 53 60  BILITOT 1.1 1.3* 1.2  PROT 6.0* 6.1* 5.9*  ALBUMIN 2.9* 2.9* 2.8*     Coagulation Profile:  Recent Labs Lab 09/27/16 1404 09/29/16 0549  INR 1.32 1.25     Recent Results (from the past 240 hour(s))  MRSA PCR Screening     Status: None   Collection Time: 09/27/16 11:51 AM  Result Value Ref Range Status   MRSA by PCR NEGATIVE NEGATIVE Final    Comment:        The GeneXpert MRSA Assay (FDA approved for NASAL specimens only), is one component of a comprehensive MRSA colonization surveillance program. It is not intended to diagnose MRSA infection nor to guide or monitor treatment for MRSA infections.   Culture, blood (Routine X 2) w Reflex to ID Panel      Status: None   Collection Time: 09/27/16  2:04 PM  Result Value Ref Range Status   Specimen Description BLOOD RIGHT ANTECUBITAL  Final   Special Requests AEROBIC BOTTLE ONLY Blood Culture adequate volume  Final   Culture   Final    NO GROWTH 5 DAYS Performed at [facility], [place] [address] [place], [place] [address]    Report Status 10/02/2016 FINAL  Final  Culture, blood (Routine X 2) w Reflex to ID Panel     Status: None   Collection Time: 09/27/16  2:04 PM  Result Value Ref Range Status   Specimen Description BLOOD RIGHT HAND  Final   Special Requests IN PEDIATRIC BOTTLE Blood Culture adequate volume  Final   Culture   Final    NO GROWTH 5 DAYS Performed at [facility], [place] [address], [place], [place] [address]    Report Status 10/02/2016 FINAL  Final      Radiology Studies: No results found.   Medications:  Scheduled: . calcium carbonate  1 tablet Oral TID WC  . Chlorhexidine Gluconate Cloth  6 each Topical Daily  . DULoxetine  30 mg Oral Daily   And  . DULoxetine  60 mg Oral Daily  . folic acid  1 mg Oral Daily  . gabapentin  800 mg Oral BID  . levothyroxine  50 mcg Oral QAC breakfast  . polyethylene glycol  17 g Oral BID  . sodium chloride flush  10-40 mL Intracatheter Q12H  . sodium chloride flush  3 mL Intravenous Q12H  . sulfamethoxazole-trimethoprim  1 tablet Oral Once per day on Mon Wed Fri  . topiramate  25 mg Oral BID   Continuous: . sodium chloride 250 mL (10/02/16 0600)  . sodium chloride 1,000 mL (10/03/16 1038)  . acyclovir Stopped (10/04/16 0355)  . fluconazole (DIFLUCAN) IV Stopped (10/03/16 2026)  . levofloxacin (LEVAQUIN) IV Stopped (10/03/16 1820)  . magnesium sulfate 1 - 4 g bolus IVPB     BWG:YKZLDJ chloride, acetaminophen, clonazePAM, HYDROcodone-acetaminophen, ondansetron (ZOFRAN) IV, sodium chloride flush, sodium chloride flush, sodium phosphate  Assessment/Plan:  Active Problems:   Pancytopenia (HCC)   Hyponatremia    Anemia requiring transfusions    Pancytopenia/Possible leukemia Patient has been transfused 7 units of PRBCs and 4 packs of platelets so far. Hemoglobin is stable this morning. Platelet counts 15. There is no need for any transfusion today. Hematology recommends keeping hemoglobin greater than 7 and platelet count greater than 15. Fibrinogen level greater than  300. Cortisol 18.8. Folic acid level was noted to be low and is being supplemented. Reason for her pancytopenia is not entirely clear. Patient underwent bone marrow biopsy on 8/9. Final report is still pending. 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 Patient is on prophylactic antibiotics with Bactrim and amoxicillin, fluconazole and acyclovir. Pathology report has confirmed presence of B-cell acute lymphoblastic leukemia. Case has been discussed by our oncologist with oncology service at [facility]. They have accepted the patient in transfer. She will be transferred once bed is available.   Swelling Right antecubital Area Probably due to a previous peripheral line. No DVT identified on Doppler study. Continue to monitor. Warm compresses.  Mild hyponatremia/hypokalemia/hypomagnesemia/hypocalcemia Sodium level has been stable. Potassium and magnesium level improved. Corrected Calcium level has also improved. Continue calcium carbonate.  Hypothyroidism. Apparently she was recently diagnosed with this by her endocrinologist at [facility]. She was started on low-dose Synthroid. Care everywhere reports reviewed. She was started on 50 g on 7/25. TSH 6.10. Free T4 level is normal at 0.75.  History of for GBS with residual weakness and neuropathy. Continue gabapentin.  DVT Prophylaxis: SCDs    Code Status: Full code  Family Communication: Discussed with the patient  Disposition Plan: Transfer to [facility].    LOS: 7 days    [place] [facility] Pager [phone] 10/04/2016, 8:25 AM  If 7PM-7AM, please contact night-coverage at [email or web address], password [facility]

## 2016-10-04 NOTE — Progress Notes (Signed)
Spoke with Arbie Cookey the Investment banker, operational. Gave her a recent set of vitals. Pt will be going to the [facility] room [phone]. Will call and give them report.

## 2016-10-04 NOTE — Discharge Summary (Addendum)
[facility]  Physician Discharge Summary   Patient ID: Eevee Borbon MRN: [id number] DOB/AGE: 63-Jul-1957 63 y.o.  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 Patient was found to have pancytopenia with hemoglobin 3.2, platelet count 10. Patient was hospitalized. Hematology/oncology was consulted.  Consultants: Hematology/oncology. PCCM  Procedures:  Bone marrow biopsy 8/9  Multiple PRBC and platelet transfusions  Antibiotics: Bactrim, Fluconazole, levofloxacin, and acyclovir.    HOSPITAL COURSE:   Acute leukemia Patient has been transfused 7 units of PRBCs and 4 packs of platelets so far. Hematology recommends keeping hemoglobin greater than 7 and platelet count greater than 15. Fibrinogen level greater than 300. Cortisol 18.8. Folic acid level was noted to be low and is being supplemented. Patient underwent bone marrow biopsy on 8/9. Patient is on prophylactic antibiotics with Bactrim and amoxicillin, fluconazole and acyclovir. Per Heme/Onc pathology report has confirmed presence of B-cell acute lymphoblastic leukemia. Case has been discussed by our oncologist with oncology service at [facility]. They have accepted the patient in transfer. She will be transferred once bed is available.   Swelling Right antecubital Area Probably due to a previous peripheral line. No DVT identified on  Doppler study. Continue to monitor. Swelling has improved.  Warm compresses.  Mild hyponatremia/hypokalemia/hypomagnesemia/hypocalcemia Sodium level has been stable. Potassium and magnesium level improved. Corrected Calcium level has also improved. Continue calcium carbonate.  Hypothyroidism. Apparently she was recently diagnosed with this by her endocrinologist at [facility]. She was started on Synthroid 65mg daily on 09/15/16. TSH 6.10. Free T4 level is normal at 0.75.  History of for GBS with residual weakness and neuropathy. Continue gabapentin.  St[facility]   PERTINENT LABS:  The results of significant diagnostics from this hospitalization (including imaging, microbiology, ancillary and laboratory) are listed below for reference.    Microbiology: Recent Results (from the past 240 hour(s))  MRSA PCR Screening     Status: None   Collection Time: 09/27/16 11:51 AM  Result Value Ref Range Status   MRSA by PCR NEGATIVE NEGATIVE Final    Comment:        The GeneXpert MRSA Assay (FDA approved for NASAL specimens only), is one component of a comprehensive MRSA colonization surveillance program. It is not intended to diagnose MRSA infection nor to guide or monitor treatment for MRSA infections.   Culture, blood (Routine X 2) w Reflex to ID Panel     Status: None   Collection Time: 09/27/16  2:04 PM  Result Value Ref Range Status   Specimen Description BLOOD RIGHT ANTECUBITAL  Final   Special Requests AEROBIC BOTTLE ONLY Blood Culture adequate volume  Final   Culture   Final    NO GROWTH 5 DAYS Performed at [facility] [id number] [place] [address]   Report Status 10/02/2016 FINAL  Final  Culture, blood (Routine X 2) w Reflex to ID Panel     Status: None  Collection Time: 09/27/16  2:04 PM  Result Value Ref Range Status   Specimen Description BLOOD RIGHT HAND  Final   Special Requests IN PEDIATRIC BOTTLE Blood Culture  adequate volume  Final   Culture   Final    NO GROWTH 5 DAYS Performed at [facility], [address] [place], [place] [address]    Report Status 10/02/2016 FINAL  Final     Labs: Basic Metabolic Panel:  Recent Labs Lab 09/30/16 0430 10/01/16 0404 10/02/16 0339 10/03/16 0503 10/04/16 0643  NA 136 134* 135 133* 134*  K 3.9 3.3* 3.0* 4.7 4.2  CL 106 106 107 104 105  CO2 22 21* 19* 23 23  GLUCOSE 102* 96 133* 102* 100*  BUN 8 8 7 9 9   CREATININE 0.48 0.41* 0.40* 0.40* 0.44  CALCIUM 7.1* 6.3* 6.5* 7.7* 7.8*  MG 1.6* 1.6* 1.5* 1.9 1.6*   Liver Function Tests:  Recent Labs Lab 09/28/16 1410 09/30/16 0430  AST 28 26  ALT 15 16  ALKPHOS 53 60  BILITOT 1.3* 1.2  PROT 6.1* 5.9*  ALBUMIN 2.9* 2.8*   CBC:  Recent Labs Lab 09/29/16 0549  09/30/16 0430 10/01/16 0404 10/01/16 1727 10/02/16 0339 10/03/16 0503 10/04/16 0643  WBC 0.9*  --  0.8* 0.6*  --  0.5* 0.5* 0.6*  NEUTROABS 0.1*  --   --  0.0*  --  0.0* 0.0* 0.0*  HGB 5.8*  < > 8.1* 6.8* 8.7* 7.6* 8.1* 7.7*  HCT 15.8*  < > 22.7* 18.4* 23.7* 21.0* 22.6* 21.4*  MCV 83.6  --  83.8 83.3  --  85.7 85.9 83.9  PLT 19*  --  12* 12*  --  15* 12* 15*  < > = values in this interval not displayed.   IMAGING STUDIES Dg Chest 2 View  Result Date: 09/27/2016 CLINICAL DATA:  Initial evaluation for worsening shortness of breath over past few days. EXAM: CHEST  2 VIEW COMPARISON:  None available. FINDINGS: Cardiac silhouette partially obscured, but grossly within normal limits. Mediastinal silhouette normal. Aortic atherosclerosis. Lungs are hypoinflated. Linear left perihilar opacities most consistent with atelectasis. No focal infiltrates. No pulmonary edema or pleural effusion. No pneumothorax. Chronic compression deformity involving the upper lumbar spine, likely L1 level. No acute osseus abnormality. IMPRESSION: 1. Shallow lung inflation with left perihilar atelectasis. No other active cardiopulmonary disease. 2. Chronic  compression deformity involving the L1 vertebral body with secondary accentuated thoracolumbar kyphosis. Electronically Signed   By: Jeannine Boga M.D.   On: 09/27/2016 06:54   Ct Biopsy  Result Date: 09/30/2016 CLINICAL DATA:  Pancytopenia EXAM: CT GUIDED DEEP ILIAC BONE ASPIRATION AND CORE BIOPSY TECHNIQUE: Patient was placed supine on the CT gantry and limited axial scans through the pelvis were obtained. Appropriate skin entry site was identified. Skin site was marked, prepped with chlorhexidine, draped in usual sterile fashion, and infiltrated locally with 1% lidocaine. Intravenous Fentanyl and Versed were administered as conscious sedation during continuous monitoring of the patient's level of consciousness and physiological / cardiorespiratory status by the radiology RN, with a total moderate sedation time of 10 minutes. Under CT fluoroscopic guidance an 11-gauge Cook trocar bone needle was advanced into the right iliac bone just lateral to the sacroiliac joint. Once needle tip position was confirmed, aspiration was attempted but there is no return x3. Therefore, 2 core samples were obtained. Post procedure scans show no hematoma or fracture. Patient tolerated procedure well. COMPLICATIONS: COMPLICATIONS none IMPRESSION: 1. Technically successful CT  guided right iliac bone core  biopsy. Electronically Signed   By: Lucrezia Europe M.D.   On: 09/30/2016 10:36   Ct Bone Marrow Biopsy & Aspiration  Result Date: 09/30/2016 CLINICAL DATA:  Pancytopenia EXAM: CT GUIDED DEEP ILIAC BONE ASPIRATION AND CORE BIOPSY TECHNIQUE: Patient was placed supine on the CT gantry and limited axial scans through the pelvis were obtained. Appropriate skin entry site was identified. Skin site was marked, prepped with chlorhexidine, draped in usual sterile fashion, and infiltrated locally with 1% lidocaine. Intravenous Fentanyl and Versed were administered as conscious sedation during continuous monitoring of the patient's  level of consciousness and physiological / cardiorespiratory status by the radiology RN, with a total moderate sedation time of 10 minutes. Under CT fluoroscopic guidance an 11-gauge Cook trocar bone needle was advanced into the right iliac bone just lateral to the sacroiliac joint. Once needle tip position was confirmed, aspiration was attempted but there is no return x3. Therefore, 2 core samples were obtained. Post procedure scans show no hematoma or fracture. Patient tolerated procedure well. COMPLICATIONS: COMPLICATIONS none IMPRESSION: 1. Technically successful CT guided right iliac bone core  biopsy. Electronically Signed   By: Lucrezia Europe M.D.   On: 09/30/2016 10:36    DISCHARGE EXAMINATION: See progress note from earlier today  ALLERGIES: No Known Allergies  Current Inpatient Medications:  Scheduled: . calcium carbonate  1 tablet Oral TID WC  . Chlorhexidine Gluconate Cloth  6 each Topical Daily  . DULoxetine  30 mg Oral Daily   And  . DULoxetine  60 mg Oral Daily  . folic acid  1 mg Oral Daily  . gabapentin  800 mg Oral BID  . levothyroxine  50 mcg Oral QAC breakfast  . polyethylene glycol  17 g Oral BID  . sodium chloride flush  10-40 mL Intracatheter Q12H  . sodium chloride flush  3 mL Intravenous Q12H  . sulfamethoxazole-trimethoprim  1 tablet Oral Once per day on Mon Wed Fri  . topiramate  25 mg Oral BID   Continuous: . sodium chloride 250 mL (10/02/16 0600)  . sodium chloride 1,000 mL (10/03/16 1038)  . acyclovir Stopped (10/04/16 1226)  . fluconazole (DIFLUCAN) IV Stopped (10/03/16 2026)  . levofloxacin (LEVAQUIN) IV Stopped (10/03/16 1820)   TMH:DQQIWL chloride, acetaminophen, clonazePAM, HYDROcodone-acetaminophen, ondansetron (ZOFRAN) IV, sodium chloride flush, sodium chloride flush, sodium phosphate   TOTAL DISCHARGE TIME: 35 mins  [place] [facility] Pager [phone]  10/04/2016, 2:18 PM

## 2016-10-11 DIAGNOSIS — R768 Other specified abnormal immunological findings in serum: Secondary | ICD-10-CM | POA: Insufficient documentation

## 2016-10-18 ENCOUNTER — Encounter (HOSPITAL_COMMUNITY): Payer: Self-pay

## 2016-10-27 ENCOUNTER — Other Ambulatory Visit: Payer: Self-pay

## 2016-10-27 ENCOUNTER — Telehealth: Payer: Self-pay

## 2016-10-27 DIAGNOSIS — C9100 Acute lymphoblastic leukemia not having achieved remission: Secondary | ICD-10-CM

## 2016-10-27 NOTE — Telephone Encounter (Signed)
Confusion regarding pt care and ability of our office to have labs drawn on patient. Multiple calls from different individuals at [facility] yesterday and today. Most likely, our office will be unable to care for her diagnosis. Message sent to Dr. Lebron Conners, returning tomorrow to make decision. Pt scheduled for CBC and CMET to be faxed to [facility] on 9/10 after completion to Dr. Jerrye Noble P: [phone] F: [phone]

## 2016-10-28 ENCOUNTER — Other Ambulatory Visit: Payer: Self-pay | Admitting: *Deleted

## 2016-10-28 ENCOUNTER — Telehealth: Payer: Self-pay | Admitting: *Deleted

## 2016-10-28 DIAGNOSIS — C9100 Acute lymphoblastic leukemia not having achieved remission: Secondary | ICD-10-CM

## 2016-10-28 NOTE — Telephone Encounter (Signed)
SW pt's husband regarding upcoming apts.  Pts husband stated Mrs. Trafton has upcoming apt for labs/possible transfusion at [facility] set up for Thursday 9/13, and wishes to cancel apt here on 9/10.  Advised husband to inform our office if/when pt would like to have labs drawn here to reduce travel to [facility].  Husband verbalized understanding/thankful for call.

## 2016-10-29 LAB — CHROMOSOME ANALYSIS, BONE MARROW

## 2016-10-29 LAB — TISSUE HYBRIDIZATION (BONE MARROW)-NCBH

## 2016-11-01 ENCOUNTER — Other Ambulatory Visit: Payer: BC Managed Care – PPO

## 2016-11-04 DIAGNOSIS — G629 Polyneuropathy, unspecified: Secondary | ICD-10-CM | POA: Insufficient documentation

## 2017-01-06 DIAGNOSIS — T8089XA Other complications following infusion, transfusion and therapeutic injection, initial encounter: Secondary | ICD-10-CM | POA: Insufficient documentation

## 2017-05-12 DIAGNOSIS — G4489 Other headache syndrome: Secondary | ICD-10-CM | POA: Insufficient documentation

## 2017-05-12 DIAGNOSIS — R2689 Other abnormalities of gait and mobility: Secondary | ICD-10-CM | POA: Insufficient documentation

## 2017-05-12 DIAGNOSIS — R42 Dizziness and giddiness: Secondary | ICD-10-CM | POA: Insufficient documentation

## 2017-05-12 DIAGNOSIS — R251 Tremor, unspecified: Secondary | ICD-10-CM | POA: Insufficient documentation

## 2017-05-31 DIAGNOSIS — G620 Drug-induced polyneuropathy: Secondary | ICD-10-CM | POA: Insufficient documentation

## 2017-06-22 DIAGNOSIS — D696 Thrombocytopenia, unspecified: Secondary | ICD-10-CM | POA: Insufficient documentation

## 2017-09-14 DIAGNOSIS — M810 Age-related osteoporosis without current pathological fracture: Secondary | ICD-10-CM | POA: Insufficient documentation

## 2017-09-23 DIAGNOSIS — D214 Benign neoplasm of connective and other soft tissue of abdomen: Secondary | ICD-10-CM | POA: Insufficient documentation

## 2017-09-23 DIAGNOSIS — D3500 Benign neoplasm of unspecified adrenal gland: Secondary | ICD-10-CM | POA: Insufficient documentation

## 2017-09-28 DIAGNOSIS — F1190 Opioid use, unspecified, uncomplicated: Secondary | ICD-10-CM | POA: Insufficient documentation

## 2017-09-28 DIAGNOSIS — K219 Gastro-esophageal reflux disease without esophagitis: Secondary | ICD-10-CM | POA: Insufficient documentation

## 2017-09-28 DIAGNOSIS — Z9221 Personal history of antineoplastic chemotherapy: Secondary | ICD-10-CM | POA: Insufficient documentation

## 2017-10-26 DIAGNOSIS — G6181 Chronic inflammatory demyelinating polyneuritis: Secondary | ICD-10-CM | POA: Insufficient documentation

## 2018-09-29 IMAGING — CT CT BIOPSY AND ASPIRATION BONE MARROW
1 of 2 series · 15 of 29 positions shown, 19 images · non-contrast
Comparison: none

CLINICAL DATA: Pancytopenia

EXAM:
CT GUIDED DEEP ILIAC BONE ASPIRATION AND CORE BIOPSY
TECHNIQUE: Patient was placed supine on the CT gantry and limited axial scans
through the pelvis were obtained. Appropriate skin entry site was
identified. Skin site was marked, prepped with chlorhexidine, draped
in usual sterile fashion, and infiltrated locally with 1% lidocaine.

[Series 2: i-spiral 5.0 b40f · axial · 0.98mm/px · z∈[+1090,+1188]mm · 15 of 34 slices shown, 19 images]
[im 3/34  mediastinal]
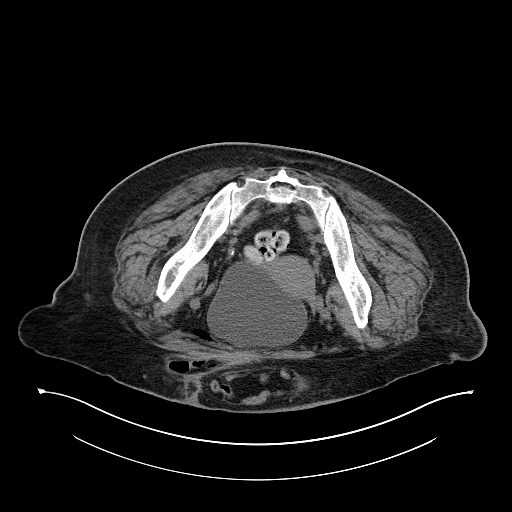
[im 3/34  lung]
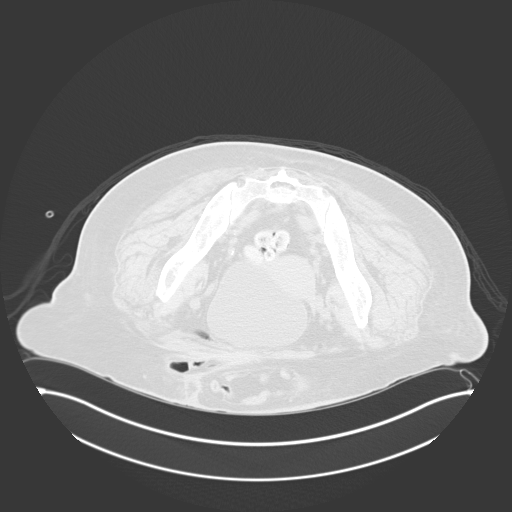
[im 5/34  lung]
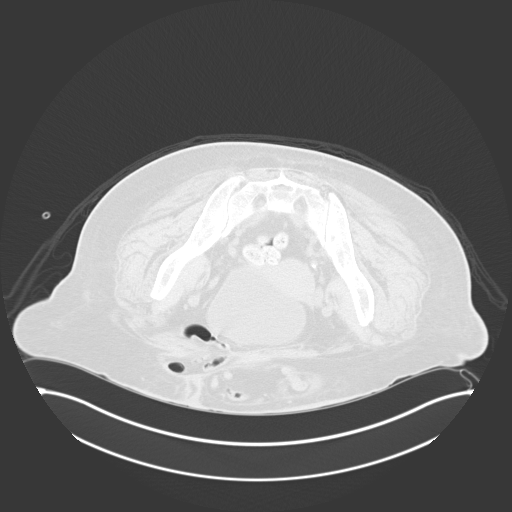
[im 7/34  lung]
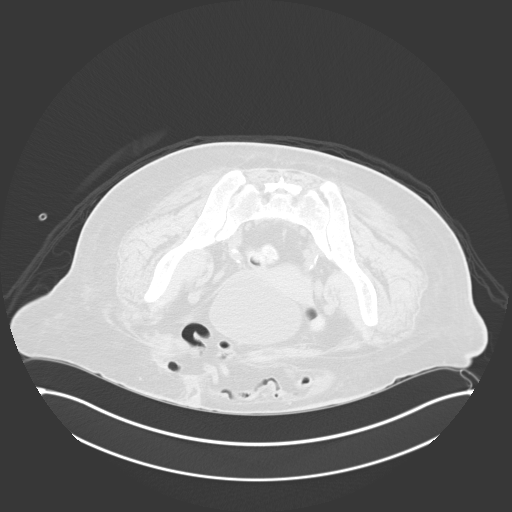
[im 9/34  lung]
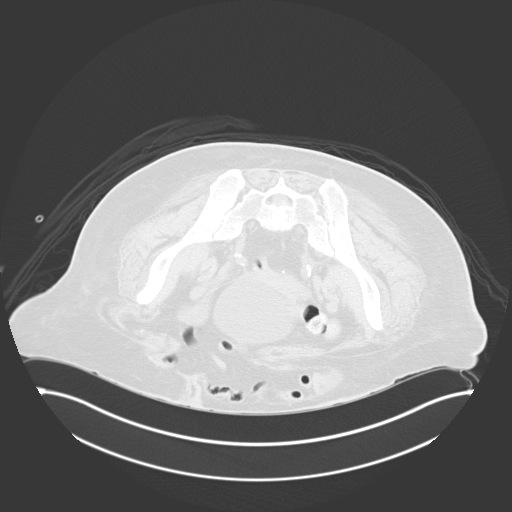
[im 11/34  mediastinal]
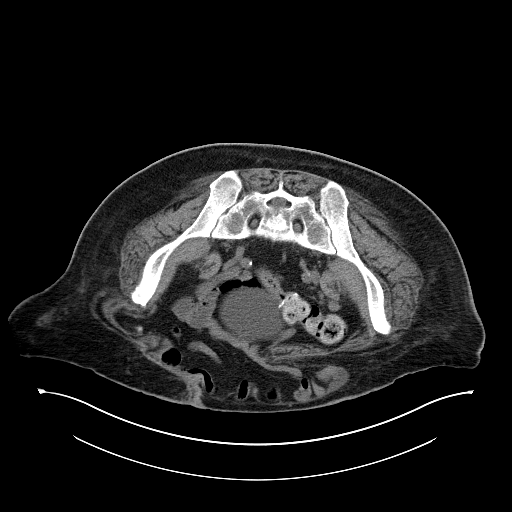
[im 11/34  lung]
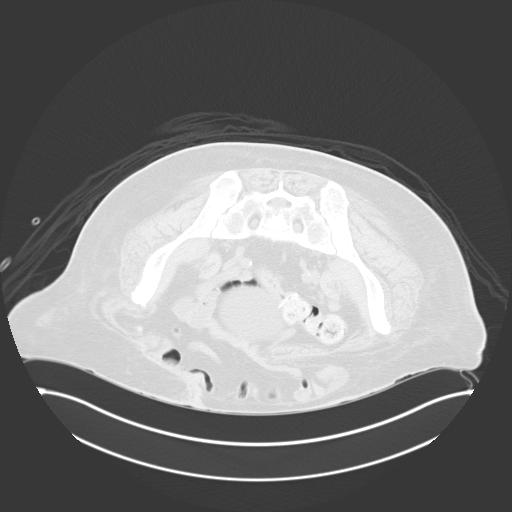
[im 12/34  lung]
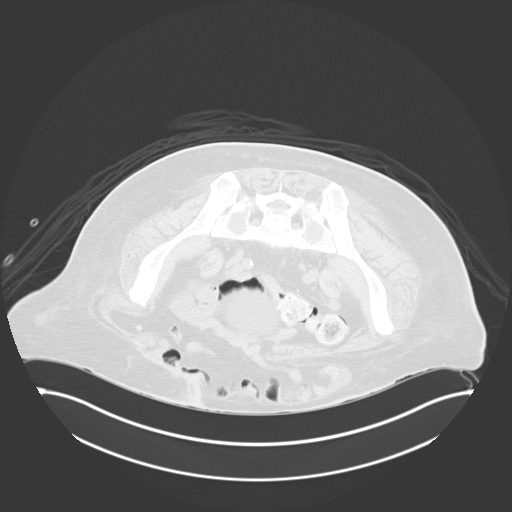
[im 13/34  lung]
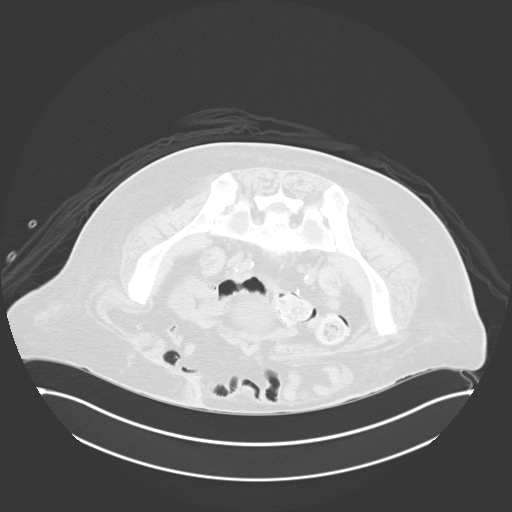
[im 17/34  lung]
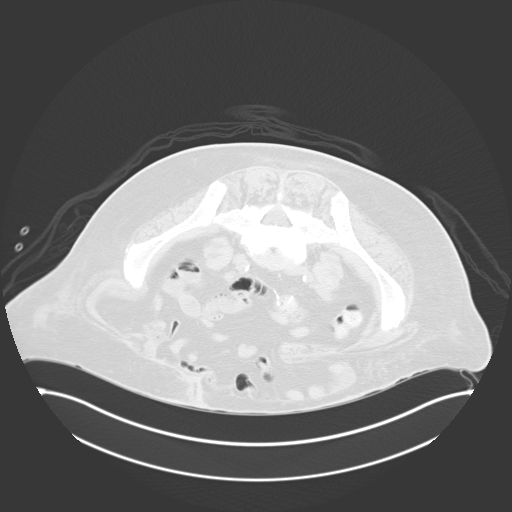
[im 19/34  mediastinal]
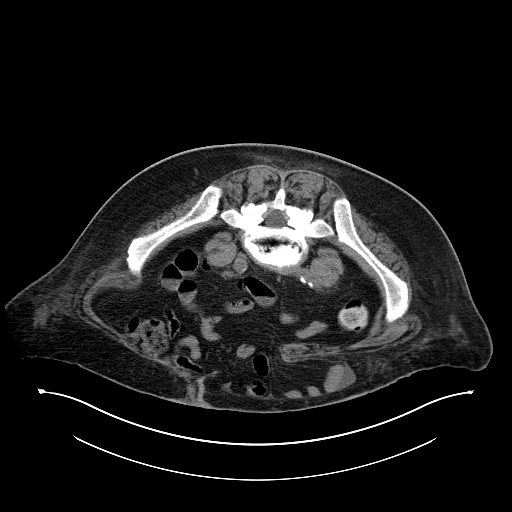
[im 19/34  lung]
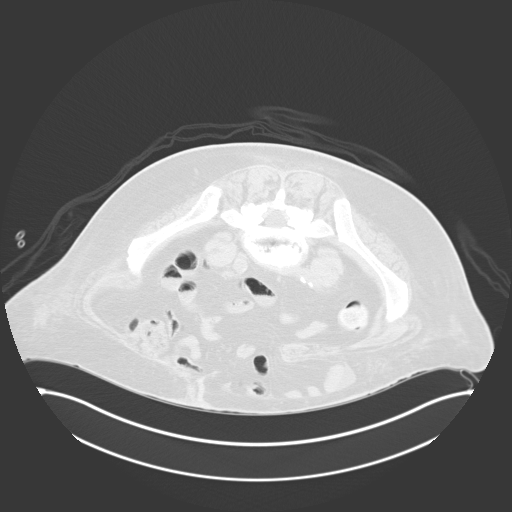
[im 21/34  lung]
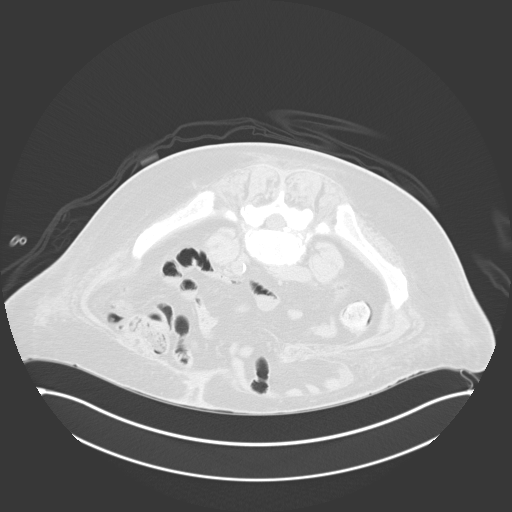
[im 23/34  lung]
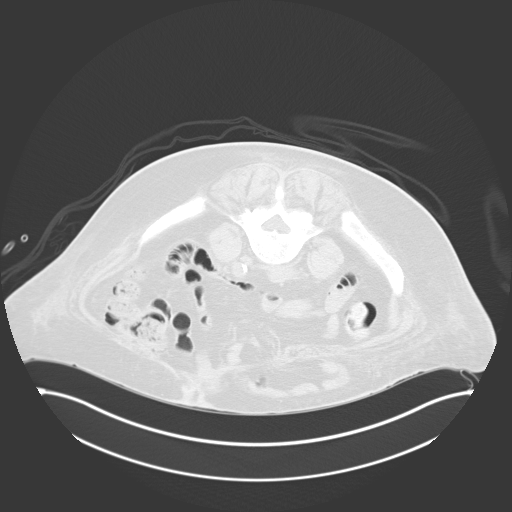
[im 25/34  lung]
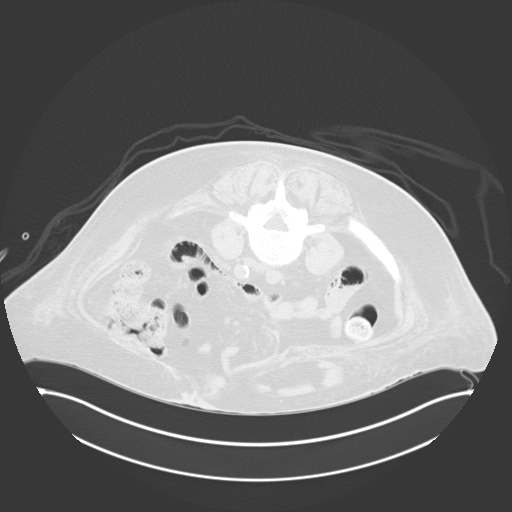
[im 27/34  mediastinal]
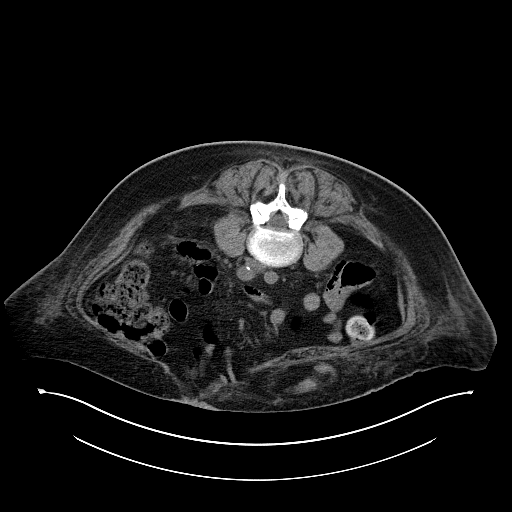
[im 27/34  lung]
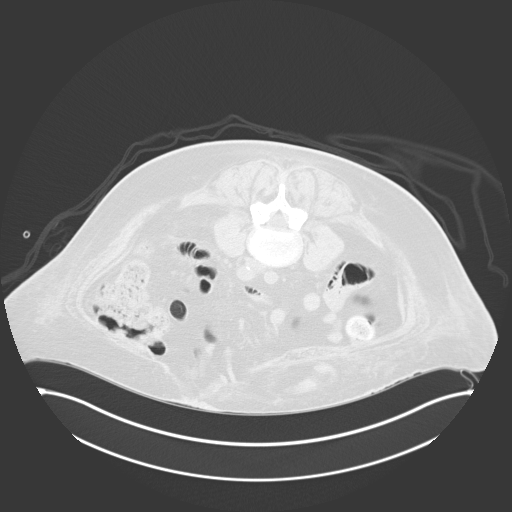
[im 29/34  lung]
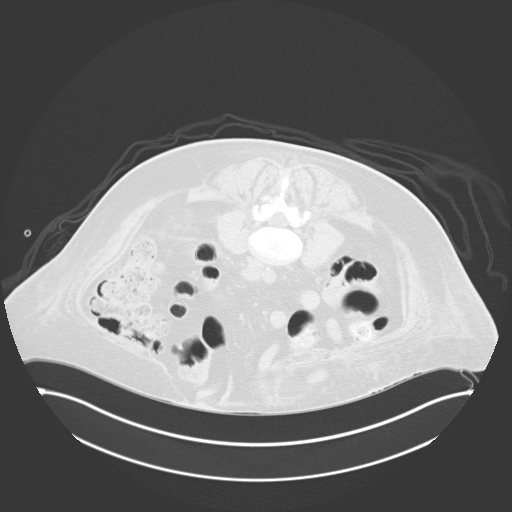
[im 31/34  lung]
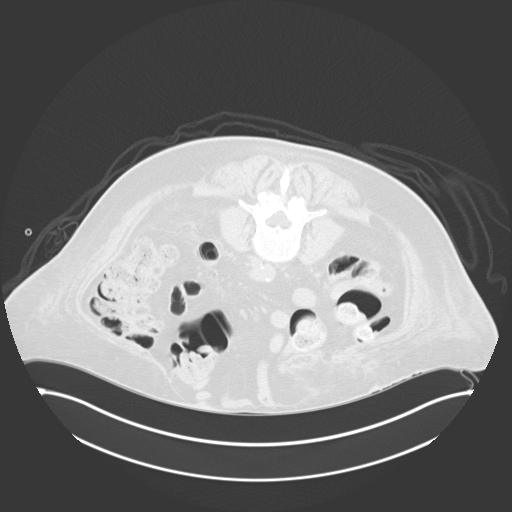

[15 of 29 positions shown; findings below may reference images not displayed]

Intravenous Fentanyl and Versed were administered as conscious
sedation during continuous monitoring of the patient's level of
consciousness and physiological / cardiorespiratory status by the
radiology RN, with a total moderate sedation time of 10 minutes.

Under CT fluoroscopic guidance an 11-gauge Cook trocar bone needle
was advanced into the right iliac bone just lateral to the
sacroiliac joint. Once needle tip position was confirmed, aspiration
was attempted but there is no return x3. Therefore, 2 core samples
were obtained. Post procedure scans show no hematoma or fracture.
Patient tolerated procedure well.

COMPLICATIONS:
COMPLICATIONS
none
IMPRESSION: 1. Technically successful CT guided right iliac bone core  biopsy.

## 2018-10-16 ENCOUNTER — Other Ambulatory Visit: Payer: Self-pay

## 2018-10-16 DIAGNOSIS — Z20822 Contact with and (suspected) exposure to covid-19: Secondary | ICD-10-CM

## 2018-10-17 LAB — NOVEL CORONAVIRUS, NAA: SARS-CoV-2, NAA: NOT DETECTED

## 2018-10-18 ENCOUNTER — Telehealth: Payer: Self-pay

## 2018-10-18 NOTE — Telephone Encounter (Signed)
Negative COVID results given. Patient results "NOT Detected." Caller expressed understanding. ° °

## 2019-07-06 DIAGNOSIS — S92405A Nondisplaced unspecified fracture of left great toe, initial encounter for closed fracture: Secondary | ICD-10-CM | POA: Insufficient documentation

## 2019-10-15 ENCOUNTER — Encounter (INDEPENDENT_AMBULATORY_CARE_PROVIDER_SITE_OTHER): Payer: Self-pay | Admitting: Ophthalmology

## 2019-10-15 ENCOUNTER — Ambulatory Visit (INDEPENDENT_AMBULATORY_CARE_PROVIDER_SITE_OTHER): Payer: Medicare Other | Admitting: Ophthalmology

## 2019-10-15 ENCOUNTER — Other Ambulatory Visit: Payer: Self-pay

## 2019-10-15 DIAGNOSIS — H04123 Dry eye syndrome of bilateral lacrimal glands: Secondary | ICD-10-CM

## 2019-10-15 DIAGNOSIS — C9101 Acute lymphoblastic leukemia, in remission: Secondary | ICD-10-CM | POA: Diagnosis not present

## 2019-10-15 DIAGNOSIS — H35372 Puckering of macula, left eye: Secondary | ICD-10-CM

## 2019-10-15 DIAGNOSIS — G4733 Obstructive sleep apnea (adult) (pediatric): Secondary | ICD-10-CM | POA: Insufficient documentation

## 2019-10-15 NOTE — Progress Notes (Signed)
10/15/2019     CHIEF COMPLAINT Patient presents for Retina Follow Up   HISTORY OF PRESENT ILLNESS: Morgan Juarez is a 64 y.o. 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  Severity is moderate.  Duration of 6 months.  Since onset it is stable.  I, the attending physician,  performed the HPI with the patient and updated documentation appropriately.          Comments    6 Month f\u OU. OCT  Pt states no changes in vision. Pt has had cat sx OD. Pt c/o OU being severely dry. Dr. Katy Fitch prescribed Restasis.       Last edited by Tilda Franco on 10/15/2019  1:21 PM. (History)      Referring physician: No referring provider defined for this encounter.  HISTORICAL INFORMATION:   Selected notes from the MEDICAL RECORD NUMBER       CURRENT MEDICATIONS: No current outpatient medications on file. (Ophthalmic Drugs)   No current facility-administered medications for this visit. (Ophthalmic Drugs)   Current Outpatient Medications (Other)  Medication Sig  . acetaminophen (TYLENOL) 325 MG tablet Take 2 tablets (650 mg total) by mouth every 6 (six) hours as needed for mild pain or headache.  Marland Kitchen acyclovir 655 mg in dextrose 5 % 100 mL Inject 655 mg into the vein every 8 (eight) hours.  . calcium carbonate (OS-CAL - DOSED IN MG OF ELEMENTAL CALCIUM) 1250 (500 Ca) MG tablet Take 1 tablet (500 mg of elemental calcium total) by mouth 3 (three) times daily with meals.  . Chlorhexidine Gluconate Cloth 2 % PADS Apply 6 each topically daily.  . cholecalciferol (VITAMIN D) 1000 units tablet Take 1,000 Units by mouth daily.  . clonazePAM (KLONOPIN) 0.5 MG tablet Take 1 tablet (0.5 mg total) by mouth at bedtime as needed (anxiety).  . DULoxetine (CYMBALTA) 30 MG capsule Take 30 mg by mouth daily.  . DULoxetine (CYMBALTA) 30 MG capsule Take 1 capsule (30 mg total) by mouth daily.  . DULoxetine (CYMBALTA) 60 MG capsule Take 60 mg by mouth  daily.  . DULoxetine (CYMBALTA) 60 MG capsule Take 1 capsule (60 mg total) by mouth daily.  . fluconazole (DIFLUCAN) 400-0.9 MG/200ML-% IVPB Inject 200 mLs (400 mg total) into the vein daily.  . folic acid (FOLVITE) 1 MG tablet Take 1 tablet (1 mg total) by mouth daily.  Marland Kitchen gabapentin (NEURONTIN) 400 MG capsule Take 2 capsules (800 mg total) by mouth 2 (two) times daily.  Marland Kitchen HYDROcodone-acetaminophen (NORCO/VICODIN) 5-325 MG tablet Take 1-2 tablets by mouth every 4 (four) hours as needed for moderate pain or severe pain.  Marland Kitchen levofloxacin (LEVAQUIN) 750 MG/150ML SOLN Inject 150 mLs (750 mg total) into the vein daily.  Marland Kitchen levothyroxine (SYNTHROID, LEVOTHROID) 50 MCG tablet Take 1 tablet (50 mcg total) by mouth daily before breakfast.  . linaclotide (LINZESS) 145 MCG CAPS capsule Take 145 mcg by mouth daily before breakfast.  . Liraglutide -Weight Management 18 MG/3ML SOPN Inject 3 mg into the skin daily.  . Multiple Vitamins-Iron (MULTIVITAMINS WITH IRON) TABS tablet Take 1 tablet by mouth daily.  . ondansetron (ZOFRAN) 4 MG/2ML SOLN injection Inject 2 mLs (4 mg total) into the vein every 6 (six) hours as needed for nausea or vomiting.  Marland Kitchen oxymorphone (OPANA ER) 10 MG 12 hr tablet Take 10 mg by mouth every 12 (twelve) hours as needed for  pain.  . polyethylene glycol (MIRALAX / GLYCOLAX) packet Take 17 g by mouth 2 (two) times daily.  . promethazine (PHENERGAN) 12.5 MG tablet Take 12.5 mg by mouth every 4 (four) hours as needed (motion sickness).  . sodium chloride 0.9 % infusion Inject 250 mLs into the vein as needed (for IV line care(Saline / Heparin Lock)).  Marland Kitchen sodium chloride 0.9 % infusion Inject 1,000 mLs into the vein continuous.  . sodium chloride flush (NS) 0.9 % SOLN Inject 3 mLs into the vein every 12 (twelve) hours.  . sodium chloride flush (NS) 0.9 % SOLN Inject 3 mLs into the vein as needed.  . sodium chloride flush (NS) 0.9 % SOLN 10-40 mLs by Intracatheter route every 12 (twelve) hours.    . sodium chloride flush (NS) 0.9 % SOLN 10-40 mLs by Intracatheter route as needed (flush).  . sodium phosphate (FLEET) 7-19 GM/118ML ENEM Place 133 mLs (1 enema total) rectally daily as needed for severe constipation.  . sulfamethoxazole-trimethoprim (BACTRIM DS,SEPTRA DS) 800-160 MG tablet Take 1 tablet by mouth 3 (three) times a week.  . topiramate (TOPAMAX) 25 MG tablet Take 1 tablet (25 mg total) by mouth 2 (two) times daily.   No current facility-administered medications for this visit. (Other)      REVIEW OF SYSTEMS:    ALLERGIES No Known Allergies  PAST MEDICAL HISTORY Past Medical History:  Diagnosis Date  . Abdominal hernia   . Anxiety   . B12 deficiency   . Depression   . Glucose intolerance   . Guillain Barr syndrome ([place])   . Hypothyroidism    Past Surgical History:  Procedure Laterality Date  . ABDOMINAL SURGERY    . COLONOSCOPY    . KYPHOPLASTY  05/24/2015    FAMILY HISTORY Family History  Problem Relation Age of Onset  . Intracerebral hemorrhage Mother   . CAD Father   . Skin cancer Father     SOCIAL HISTORY Social History   Tobacco Use  . Smoking status: Former Smoker    Types: Cigarettes    Quit date: 02/22/2013    Years since quitting: 6.6  . Smokeless tobacco: Never Used  Substance Use Topics  . Alcohol use: No  . Drug use: No         OPHTHALMIC EXAM:  Base Eye Exam    Visual Acuity (Snellen - Linear)      Right Left   Dist [place] 20/20 -1 20/20       Tonometry (Tonopen, 1:24 PM)      Right Left   Pressure 13 13       Pupils      Pupils Dark Light Shape React APD   Right PERRL 3 3 Round Minimal None   Left PERRL 3 3 Round Minimal None       Visual Fields (Counting fingers)      Left Right    Full Full       Neuro/Psych    Oriented x3: Yes   Mood/Affect: Normal       Dilation    Both eyes: 1.0% Mydriacyl, 2.5% Phenylephrine @ 1:24 PM        Slit Lamp and Fundus Exam    External Exam      Right Left    External Normal Normal       Slit Lamp Exam      Right Left   Lids/Lashes Normal Normal   Conjunctiva/Sclera White and quiet White and quiet   Cornea  Clear Clear   Anterior Chamber Deep and quiet Deep and quiet   Iris Round and reactive Round and reactive   Lens Centered posterior chamber intraocular lens Centered posterior chamber intraocular lens   Vitreous Normal Normal          IMAGING AND PROCEDURES  Imaging and Procedures for 10/15/19  OCT, Retina - OU - Both Eyes       Right Eye Quality was good. Scan locations included subfoveal. Central Foveal Thickness: 259. Findings include normal foveal contour.   Left Eye Quality was good. Scan locations included subfoveal. Central Foveal Thickness: 299. Progression has been stable. Findings include epiretinal membrane, abnormal foveal contour.                 ASSESSMENT/PLAN:  Dry eyes, bilateral She continues on Restasis, yet still with complaints and symptoms.  I recommend follow-up with Dr. Clent Jacks  Left epiretinal membrane The nature of macular pucker (epiretinal membrane ERM) was discussed with the patient as well as threshold criteria for vitrectomy surgery. I explained that in rare cases another surgery is needed to actually remove a second wrinkle should it regrow.  Most often, the epiretinal membrane and underlying wrinkled internal limiting membrane are removed with the first surgery, to accomplish the goals.   If the operative eye is Phak[place] (natural lens still present), cataract surgery is often recommended prior to Vitrectomy. This will enable the retina surgeon to have the best view during surgery and the patient to obtain optimal results in the future. Treatment options were discussed.  This, no topographic distortion and no visual acuity decline from the epiretinal membrane will thus continue to observe and follow-up in 1 year  Obstructive sleep apnea She is grateful upon her last examination February  2021, that there is eye doctor encouraged requested that she resume her CPAP use after going through some of her other medical issues.    She reports that she is grateful she has done so      ICD-10-CM   1. 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 Obstructive sleep apnea  G47.33   4. Dry eyes, bilateral  H04.123     1.  Patient to notify the office promptly if new difficulties in acuity or acute or distortion develop  2.  No interval change in the epiretinal membrane left eye  3.  Patient recommended to continue on CPAP to protect the macular oxygenation process  Ophthalmic Meds Ordered this visit:  No orders of the defined types were placed in this encounter.      Return in about 1 year (around 10/14/2020) for DILATE OU, OCT.  There are no Patient Instructions on file for this visit.   Explained the diagnoses, plan, and follow up with the patient and they expressed understanding.  Patient expressed understanding of the importance of proper follow up care.   Clent Demark Evvie Behrmann M.D. Diseases & Surgery of the Retina and Vitreous [facility] [place] 10/15/19     Abbreviations: M myopia (nearsighted); A astigmatism; H hyperopia (farsighted); P presbyopia; Mrx spectacle prescription;  CTL contact lenses; OD right eye; OS left eye; OU both eyes  XT exotropia; ET esotropia; PEK punctate epithelial keratitis; PEE punctate epithelial erosions; DES dry eye syndrome; MGD meibomian gland dysfunction; ATs artificial tears; PFAT's preservative free artificial tears; [place] nuclear sclerotic cataract; PSC posterior subcapsular cataract; ERM epi-retinal membrane;  PVD posterior vitreous detachment; RD retinal detachment; DM diabetes mellitus; DR diabetic retinopathy; NPDR non-proliferative diabetic retinopathy; PDR proliferative diabetic retinopathy; CSME clinically significant macular edema; DME diabetic  macular edema; dbh dot blot hemorrhages; CWS cotton wool spot; POAG primary open angle glaucoma; C/D cup-to-disc ratio; HVF humphrey visual field; GVF goldmann visual field; OCT optical coherence tomography; IOP intraocular pressure; BRVO Branch retinal vein occlusion; CRVO central retinal vein occlusion; CRAO central retinal artery occlusion; BRAO branch retinal artery occlusion; RT retinal tear; SB scleral buckle; PPV pars plana vitrectomy; VH Vitreous hemorrhage; PRP panretinal laser photocoagulation; IVK intravitreal kenalog; VMT vitreomacular traction; MH Macular hole;  NVD neovascularization of the disc; NVE neovascularization elsewhere; AREDS age related eye disease study; ARMD age related macular degeneration; POAG primary open angle glaucoma; EBMD epithelial/anterior basement membrane dystrophy; ACIOL anterior chamber intraocular lens; IOL intraocular lens; PCIOL posterior chamber intraocular lens; Phaco/IOL phacoemulsification with intraocular lens placement; [place] photorefractive keratectomy; LASIK laser assisted in situ keratomileusis; HTN hypertension; DM diabetes mellitus; COPD chronic obstructive pulmonary disease

## 2019-10-15 NOTE — Assessment & Plan Note (Signed)
She continues on Restasis, yet still with complaints and symptoms.  I recommend follow-up with Dr. Clent Jacks

## 2019-10-15 NOTE — Assessment & Plan Note (Addendum)
She is grateful upon her last examination February 2021, that there is eye doctor encouraged requested that she resume her CPAP use after going through some of her other medical issues.    She reports that she is grateful she has done so

## 2019-10-15 NOTE — Assessment & Plan Note (Signed)
The nature of macular pucker (epiretinal membrane ERM) was discussed with the patient as well as threshold criteria for vitrectomy surgery. I explained that in rare cases another surgery is needed to actually remove a second wrinkle should it regrow.  Most often, the epiretinal membrane and underlying wrinkled internal limiting membrane are removed with the first surgery, to accomplish the goals.   If the operative eye is Phakic (natural lens still present), cataract surgery is often recommended prior to Vitrectomy. This will enable the retina surgeon to have the best view during surgery and the patient to obtain optimal results in the future. Treatment options were discussed.  This, no topographic distortion and no visual acuity decline from the epiretinal membrane will thus continue to observe and follow-up in 1 year

## 2020-03-31 ENCOUNTER — Encounter: Payer: Self-pay | Admitting: Podiatry

## 2020-03-31 ENCOUNTER — Ambulatory Visit: Payer: Medicare PPO | Admitting: Podiatry

## 2020-03-31 ENCOUNTER — Other Ambulatory Visit: Payer: Self-pay

## 2020-03-31 DIAGNOSIS — L84 Corns and callosities: Secondary | ICD-10-CM

## 2020-03-31 DIAGNOSIS — G629 Polyneuropathy, unspecified: Secondary | ICD-10-CM

## 2020-03-31 DIAGNOSIS — M21371 Foot drop, right foot: Secondary | ICD-10-CM | POA: Diagnosis not present

## 2020-03-31 DIAGNOSIS — F32A Depression, unspecified: Secondary | ICD-10-CM | POA: Insufficient documentation

## 2020-03-31 DIAGNOSIS — R569 Unspecified convulsions: Secondary | ICD-10-CM | POA: Insufficient documentation

## 2020-03-31 DIAGNOSIS — D4959 Neoplasm of unspecified behavior of other genitourinary organ: Secondary | ICD-10-CM | POA: Insufficient documentation

## 2020-03-31 DIAGNOSIS — M21372 Foot drop, left foot: Secondary | ICD-10-CM

## 2020-03-31 DIAGNOSIS — G610 Guillain-Barre syndrome: Secondary | ICD-10-CM

## 2020-03-31 DIAGNOSIS — K469 Unspecified abdominal hernia without obstruction or gangrene: Secondary | ICD-10-CM | POA: Insufficient documentation

## 2020-03-31 DIAGNOSIS — M7741 Metatarsalgia, right foot: Secondary | ICD-10-CM

## 2020-03-31 DIAGNOSIS — M7742 Metatarsalgia, left foot: Secondary | ICD-10-CM

## 2020-03-31 DIAGNOSIS — R609 Edema, unspecified: Secondary | ICD-10-CM

## 2020-03-31 DIAGNOSIS — E119 Type 2 diabetes mellitus without complications: Secondary | ICD-10-CM | POA: Insufficient documentation

## 2020-03-31 DIAGNOSIS — E039 Hypothyroidism, unspecified: Secondary | ICD-10-CM | POA: Insufficient documentation

## 2020-03-31 NOTE — Patient Instructions (Signed)
Compression Stockings/Socks  [facility] [address] [place], [place]  [address]  [phone] Call for an appointment  

## 2020-04-07 ENCOUNTER — Ambulatory Visit: Payer: Medicare PPO | Admitting: Orthotics

## 2020-04-07 ENCOUNTER — Other Ambulatory Visit: Payer: Self-pay

## 2020-04-07 DIAGNOSIS — R2689 Other abnormalities of gait and mobility: Secondary | ICD-10-CM

## 2020-04-07 NOTE — Progress Notes (Signed)
Cast today for [place] brace (standard breeze); I evaluate her for foot drop and she has more instability in frontal plane than saggital.  Gait analysis reveal ove rpronation but good toe clearance right foot mainly.  Left was good all planes.     Dawn to [employer] on insurance.

## 2020-04-07 NOTE — Progress Notes (Signed)
Subjective:   Patient ID: Morgan Juarez, female   DOB: 65 y.o.   MRN: [id number]   HPI 65 year old female presents the office today for concerns of foot drop, bilateral foot discomfort.  She also has calluses and a possible ingrown toenail of the right big toe.  She states that she has dropfoot which recently began with Guillain-Barr syndrome.  She has had an over-the-counter brace but never had a custom brace made.  She gets some numbness into the forefoot as well as in the ball of her foot and discomfort.  She denies any recent injury or trauma.  She does that she falls a lot has issues with balance.  She is going to physical therapy currently for this.  She is on gabapentin for neuropathy.  She has been using a callus removal on the callus on her right big toe which she thinks she left on for too long.  Denies any drainage or pus or swelling.  She has no other concerns.   Review of Systems  All other systems reviewed and are negative.  Past Medical History:  Diagnosis Date  . Abdominal hernia   . Anxiety   . B12 deficiency   . Depression   . Glucose intolerance   . Guillain Barr syndrome ([place])   . Hypothyroidism     Past Surgical History:  Procedure Laterality Date  . ABDOMINAL SURGERY    . COLONOSCOPY    . KYPHOPLASTY  05/24/2015     Current Outpatient Medications:  .  amoxicillin (AMOXIL) 500 MG capsule, Take 4 capsules by mouth one hour prior to dental procedure for prophylaxis given indwelling catheter., Disp: , Rfl:  .  Calcium Carb-Cholecalciferol 500-400 MG-UNIT CHEW,  1 tablet, CHEWED, BID (2 times a day), 180 tablet, 0 Refill(s), Activate Med (Rx), Disp: , Rfl:  .  Calcium Polycarbophil (FIBER) 625 MG TABS, Take by mouth., Disp: , Rfl:  .  clobetasol ointment (TEMOVATE) 0.05 %, Apply topically., Disp: , Rfl:  .  clotrimazole (LOTRIMIN) 1 % cream, Apply topically., Disp: , Rfl:  .  cycloSPORINE (RESTASIS) 0.05 % ophthalmic emulsion, , Disp: , Rfl:  .  lidocaine  (XYLOCAINE) 2 % solution, Use as directed 15 mLs in the mouth or throat as needed for Pain (Swish, hold, and spit as needed for mouth sores/pain.)., Disp: , Rfl:  .  magnesium oxide (MAG-OX) 400 MG tablet, Take by mouth., Disp: , Rfl:  .  Melatonin 5 MG CAPS, 1 TO 2 TABLET BY MOUTH AT BEDTIME, Disp: , Rfl:  .  morphine (MS CONTIN) 15 MG 12 hr tablet, Take by mouth., Disp: , Rfl:  .  Multiple Vitamin (QUINTABS) TABS, Take 1 tablet by mouth daily., Disp: , Rfl:  .  Naldemedine Tosylate (SYMPROIC) 0.2 MG TABS, Take 1 tablet by mouth daily., Disp: , Rfl:  .  naloxone (NARCAN) nasal spray 4 mg/0.1 mL, , Disp: , Rfl:  .  nystatin cream (MYCOSTATIN), APPLY TO AFFECTED AREA TWICE A DAY, Disp: , Rfl:  .  oxyCODONE (OXY IR/ROXICODONE) 5 MG immediate release tablet, Take by mouth., Disp: , Rfl:  .  pantoprazole (PROTONIX) 40 MG tablet, Take by mouth., Disp: , Rfl:  .  potassium chloride (MICRO-K) 10 MEQ CR capsule, Take by mouth., Disp: , Rfl:  .  traMADol (ULTRAM) 50 MG tablet, Take 1 tablet by mouth every 8 (eight) hours as needed., Disp: , Rfl:  .  triamcinolone (KENALOG) 0.1 %, 1 time daily as needed., Disp: , Rfl:  .  acetaminophen (TYLENOL) 325 MG tablet, Take 2 tablets (650 mg total) by mouth every 6 (six) hours as needed for mild pain or headache., Disp: , Rfl:  .  acyclovir (ZOVIRAX) 400 MG tablet, Take by mouth., Disp: , Rfl:  .  acyclovir 655 mg in dextrose 5 % 100 mL, Inject 655 mg into the vein every 8 (eight) hours., Disp: , Rfl:  .  buPROPion (WELLBUTRIN SR) 100 MG 12 hr tablet, Take by mouth., Disp: , Rfl:  .  calcium carbonate (OS-CAL - DOSED IN MG OF ELEMENTAL CALCIUM) 1250 (500 Ca) MG tablet, Take 1 tablet (500 mg of elemental calcium total) by mouth 3 (three) times daily with meals., Disp: , Rfl:  .  carboxymethylcellulose (REFRESH PLUS) 0.5 % SOLN, 1 Drop PRN for Discomfort., Disp: , Rfl:  .  Chlorhexidine Gluconate Cloth 2 % PADS, Apply 6 each topically daily., Disp: , Rfl:  .   cholecalciferol (VITAMIN D) 1000 units tablet, Take 1,000 Units by mouth daily., Disp: , Rfl:  .  clonazePAM (KLONOPIN) 0.5 MG tablet, Take 1 tablet (0.5 mg total) by mouth at bedtime as needed (anxiety)., Disp: 30 tablet, Rfl: 0 .  cyanocobalamin 1000 MCG tablet, Take by mouth., Disp: , Rfl:  .  dapsone 100 MG tablet, Take by mouth., Disp: , Rfl:  .  DEXILANT 60 MG capsule, Take 1 capsule by mouth daily., Disp: , Rfl:  .  Docusate Sodium (DSS) 100 MG CAPS, Take by mouth., Disp: , Rfl:  .  DULoxetine (CYMBALTA) 30 MG capsule, Take 30 mg by mouth daily., Disp: , Rfl:  .  DULoxetine (CYMBALTA) 30 MG capsule, Take 1 capsule (30 mg total) by mouth daily., Disp: , Rfl: 3 .  DULoxetine (CYMBALTA) 60 MG capsule, Take 60 mg by mouth daily., Disp: , Rfl:  .  DULoxetine (CYMBALTA) 60 MG capsule, Take 1 capsule (60 mg total) by mouth daily., Disp: , Rfl: 3 .  famotidine (PEPCID) 40 MG tablet, Take 40 mg by mouth at bedtime., Disp: , Rfl:  .  fluconazole (DIFLUCAN) 200 MG tablet, Take by mouth., Disp: , Rfl:  .  fluconazole (DIFLUCAN) 400-0.9 MG/200ML-% IVPB, Inject 200 mLs (400 mg total) into the vein daily., Disp: 244 mL, Rfl:  .  folic acid (FOLVITE) 1 MG tablet, Take 1 tablet (1 mg total) by mouth daily., Disp: , Rfl:  .  gabapentin (NEURONTIN) 400 MG capsule, Take 2 capsules (800 mg total) by mouth 2 (two) times daily., Disp: , Rfl:  .  HYDROcodone-acetaminophen (NORCO/VICODIN) 5-325 MG tablet, Take 1-2 tablets by mouth every 4 (four) hours as needed for moderate pain or severe pain., Disp: 30 tablet, Rfl: 0 .  levETIRAcetam (KEPPRA) 500 MG tablet, Take by mouth., Disp: , Rfl:  .  levofloxacin (LEVAQUIN) 750 MG/150ML SOLN, Inject 150 mLs (750 mg total) into the vein daily., Disp: 1000 mL, Rfl:  .  levothyroxine (SYNTHROID, LEVOTHROID) 50 MCG tablet, Take 1 tablet (50 mcg total) by mouth daily before breakfast., Disp: , Rfl:  .  linaclotide (LINZESS) 145 MCG CAPS capsule, Take 145 mcg by mouth daily  before breakfast., Disp: , Rfl:  .  Liraglutide -Weight Management 18 MG/3ML SOPN, Inject 3 mg into the skin daily., Disp: , Rfl:  .  Multiple Vitamins-Iron (MULTIVITAMINS WITH IRON) TABS tablet, Take 1 tablet by mouth daily., Disp: , Rfl:  .  ondansetron (ZOFRAN) 4 MG/2ML SOLN injection, Inject 2 mLs (4 mg total) into the vein every 6 (six) hours as needed for  nausea or vomiting., Disp: 2 mL, Rfl: 0 .  oxymorphone (OPANA ER) 10 MG 12 hr tablet, Take 10 mg by mouth every 12 (twelve) hours as needed for pain., Disp: , Rfl:  .  polyethylene glycol (MIRALAX / GLYCOLAX) packet, Take 17 g by mouth 2 (two) times daily., Disp: 14 each, Rfl: 0 .  promethazine (PHENERGAN) 12.5 MG tablet, Take 12.5 mg by mouth every 4 (four) hours as needed (motion sickness)., Disp: , Rfl:  .  sodium chloride 0.9 % infusion, Inject 250 mLs into the vein as needed (for IV line care(Saline / Heparin Lock))., Disp: , Rfl: 0 .  sodium chloride 0.9 % infusion, Inject 1,000 mLs into the vein continuous., Disp: , Rfl: 0 .  sodium chloride flush (NS) 0.9 % SOLN, Inject 3 mLs into the vein every 12 (twelve) hours., Disp: , Rfl:  .  sodium chloride flush (NS) 0.9 % SOLN, Inject 3 mLs into the vein as needed., Disp: , Rfl:  .  sodium chloride flush (NS) 0.9 % SOLN, 10-40 mLs by Intracatheter route every 12 (twelve) hours., Disp: , Rfl:  .  sodium chloride flush (NS) 0.9 % SOLN, 10-40 mLs by Intracatheter route as needed (flush)., Disp: , Rfl:  .  sodium phosphate (FLEET) 7-19 GM/118ML ENEM, Place 133 mLs (1 enema total) rectally daily as needed for severe constipation., Disp: , Rfl: 0 .  sulfamethoxazole-trimethoprim (BACTRIM DS,SEPTRA DS) 800-160 MG tablet, Take 1 tablet by mouth 3 (three) times a week., Disp: , Rfl:  .  topiramate (TOPAMAX) 25 MG tablet, Take 1 tablet (25 mg total) by mouth 2 (two) times daily., Disp: , Rfl:  .  valACYclovir (VALTREX) 500 MG tablet, Take by mouth., Disp: , Rfl:   Allergies  Allergen Reactions  .  Sulfamethoxazole-Trimethoprim Rash         Objective:  Physical Exam  General: AAO x3, NAD  Dermatological: Mild hyperkeratotic tissue right hallux without any underlying ulceration drainage or signs of infection but there is no open lesion identified at this time.  There is no drainage or pus or signs of infection.  There is minimal ingrown toenail present without any signs of infection. There are no open sores, no preulcerative lesions, no rash or signs of infection present.  Vascular: Dorsalis Pedis artery and Posterior Tibial artery pedal pulses are 2/4 bilateral with immedate capillary fill time.  There is no pain with calf compression, swelling, warmth, erythema.  Chronic bilateral lower extremity edema present.  Neruologic: Sensation decreased with Semmes Weinstein monofilament.  She has numbness from the midfoot distally.  Musculoskeletal: Decreased dorsiflexion strength bilaterally.  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  There is no area pinpoint tenderness.  Prominent metatarsal heads plantarly.  No edema, erythema.    Gait: Unassisted, Nonantalgic.      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  I will have her follow-up with our pedorthotist, Rick for the brace. -Debride the hyperkeratotic tissue right hallux with any complications.  Recommended to hold off on callus removal. -Continue gabapentin for neuropathy -Recommend compression socks for swelling.  Trula Slade DPM

## 2020-05-30 ENCOUNTER — Other Ambulatory Visit: Payer: Self-pay | Admitting: Neurosurgery

## 2020-05-30 DIAGNOSIS — S32010S Wedge compression fracture of first lumbar vertebra, sequela: Secondary | ICD-10-CM

## 2020-07-01 ENCOUNTER — Ambulatory Visit: Payer: Self-pay | Admitting: Obstetrics and Gynecology

## 2020-07-17 ENCOUNTER — Ambulatory Visit (INDEPENDENT_AMBULATORY_CARE_PROVIDER_SITE_OTHER): Payer: Medicare PPO | Admitting: Family Medicine

## 2020-07-17 ENCOUNTER — Other Ambulatory Visit: Payer: Self-pay

## 2020-07-17 ENCOUNTER — Other Ambulatory Visit (HOSPITAL_COMMUNITY)
Admission: RE | Admit: 2020-07-17 | Discharge: 2020-07-17 | Disposition: A | Discharge status: Home / Self Care | Payer: Medicare PPO | Source: Ambulatory Visit | Attending: Obstetrics and Gynecology | Admitting: Obstetrics and Gynecology

## 2020-07-17 ENCOUNTER — Encounter: Payer: Self-pay | Admitting: Family Medicine

## 2020-07-17 VITALS — BP 112/78 | HR 93 | Ht 62.0 in | Wt 178.0 lb

## 2020-07-17 DIAGNOSIS — Z124 Encounter for screening for malignant neoplasm of cervix: Secondary | ICD-10-CM | POA: Insufficient documentation

## 2020-07-17 DIAGNOSIS — N951 Menopausal and female climacteric states: Secondary | ICD-10-CM | POA: Diagnosis not present

## 2020-07-17 DIAGNOSIS — Z01419 Encounter for gynecological examination (general) (routine) without abnormal findings: Secondary | ICD-10-CM

## 2020-07-17 DIAGNOSIS — Z86018 Personal history of other benign neoplasm: Secondary | ICD-10-CM

## 2020-07-17 NOTE — Patient Instructions (Signed)
Preventive Care 61 Years and Older, Female Preventive care refers to lifestyle choices and visits with your health care provider that can promote health and wellness. This includes:  A yearly physical exam. This is also called an annual wellness visit.  Regular dental and eye exams.  Immunizations.  Screening for certain conditions.  Healthy lifestyle choices, such as: ? Eating a healthy diet. ? Getting regular exercise. ? Not using drugs or products that contain nicotine and tobacco. ? Limiting alcohol use. What can I expect for my preventive care visit? Physical exam Your health care provider will check your:  Height and weight. These may be used to calculate your BMI (body mass index). BMI is a measurement that tells if you are at a healthy weight.  Heart rate and blood pressure.  Body temperature.  Skin for abnormal spots. Counseling Your health care provider may ask you questions about your:  Past medical problems.  Family's medical history.  Alcohol, tobacco, and drug use.  Emotional well-being.  Home life and relationship well-being.  Sexual activity.  Diet, exercise, and sleep habits.  History of falls.  Memory and ability to understand (cognition).  Work and work Statistician.  Pregnancy and menstrual history.  Access to firearms. What immunizations do I need? Vaccines are usually given at various ages, according to a schedule. Your health care provider will recommend vaccines for you based on your age, medical history, and lifestyle or other factors, such as travel or where you work.   What tests do I need? Blood tests  Lipid and cholesterol levels. These may be checked every 5 years, or more often depending on your overall health.  Hepatitis C test.  Hepatitis B test. Screening  Lung cancer screening. You may have this screening every year starting at age 74 if you have a 30-pack-year history of smoking and currently smoke or have quit within  the past 15 years.  Colorectal cancer screening. ? All adults should have this screening starting at age 44 and continuing until age 58. ? Your health care provider may recommend screening at age 2 if you are at increased risk. ? You will have tests every 1-10 years, depending on your results and the type of screening test.  Diabetes screening. ? This is done by checking your blood sugar (glucose) after you have not eaten for a while (fasting). ? You may have this done every 1-3 years.  Mammogram. ? This may be done every 1-2 years. ? Talk with your health care provider about how often you should have regular mammograms.  Abdominal aortic aneurysm (AAA) screening. You may need this if you are a current or former smoker.  BRCA-related cancer screening. This may be done if you have a family history of breast, ovarian, tubal, or peritoneal cancers. Other tests  STD (sexually transmitted disease) testing, if you are at risk.  Bone density scan. This is done to screen for osteoporosis. You may have this done starting at age 104. Talk with your health care provider about your test results, treatment options, and if necessary, the need for more tests. Follow these instructions at home: Eating and drinking  Eat a diet that includes fresh fruits and vegetables, whole grains, lean protein, and low-fat dairy products. Limit your intake of foods with high amounts of sugar, saturated fats, and salt.  Take vitamin and mineral supplements as recommended by your health care provider.  Do not drink alcohol if your health care provider tells you not to drink.  If you drink alcohol: ? Limit how much you have to 0-1 drink a day. ? Be aware of how much alcohol is in your drink. In the [place] one drink equals one 12 oz bottle of beer (355 mL), one 5 oz glass of wine (148 mL), or one 1 oz glass of hard liquor (44 mL).   Lifestyle  Take daily care of your teeth and gums. Brush your teeth every morning  and night with fluoride toothpaste. Floss one time each day.  Stay active. Exercise for at least 30 minutes 5 or more days each week.  Do not use any products that contain nicotine or tobacco, such as cigarettes, e-cigarettes, and chewing tobacco. If you need help quitting, ask your health care provider.  Do not use drugs.  If you are sexually active, practice safe sex. Use a condom or other form of protection in order to prevent STIs (sexually transmitted infections).  Talk with your health care provider about taking a low-dose aspirin or statin.  Find healthy ways to cope with stress, such as: ? Meditation, yoga, or listening to music. ? Journaling. ? Talking to a trusted person. ? Spending time with friends and family. Safety  Always wear your seat belt while driving or riding in a vehicle.  Do not drive: ? If you have been drinking alcohol. Do not ride with someone who has been drinking. ? When you are tired or distracted. ? While texting.  Wear a helmet and other protective equipment during sports activities.  If you have firearms in your house, make sure you follow all gun safety procedures. What's next?  Visit your health care provider once a year for an annual wellness visit.  Ask your health care provider how often you should have your eyes and teeth checked.  Stay up to date on all vaccines. This information is not intended to replace advice given to you by your health care provider. Make sure you discuss any questions you have with your health care provider. Document Revised: 01/30/2020 Document Reviewed: 02/02/2018 Elsevier Patient Education  2021 Elsevier Inc.  

## 2020-07-17 NOTE — Assessment & Plan Note (Signed)
Some fullness felt in LLQ, may be due to large hernia but given history will send for pelvic US, follow up pending results.

## 2020-07-17 NOTE — Assessment & Plan Note (Addendum)
Significant dryness noted on labial and vaginal exam. No obvious signs of sclerotic disease requiring biopsy, ctm. Not interested in hormonal therapy.

## 2020-07-17 NOTE — Assessment & Plan Note (Signed)
Cancer screening: - Pap: collected today - Mammogram: will request records - Colonoscopy: reports recently normal  Contraception: n/a  Mood: good  Vaccinations: Defer to PCP  Metabolic: Defer to PCP

## 2020-07-17 NOTE — Progress Notes (Signed)
GYNECOLOGY OFFICE VISIT NOTE  History:   Morgan Juarez is a 65 y.o. with hx of ALL s/p chemotherapy in 2018, benign ovarian mass s/p removal in 2015, T2DM, benign GIST, chronic opioid use, pancytopenia, here for annual exam.  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 HEMOGLOBIN A1C  Never done  . FOOT EXAM  Never done  . URINE MICROALBUMIN  Never done  . COVID-19 Vaccine (1) Never done  . Hepatitis C Screening  Never done  . COLONOSCOPY (Pts 45-36yrs Insurance coverage will need to be confirmed)  Never done  . MAMMOGRAM  Never done  . TETANUS/TDAP  03/27/2015  . DEXA SCAN  Never done  . PNA vac Low Risk Adult (2 of 2 - PPSV23) 11/23/2018    Past Medical History:  Diagnosis Date  . Abdominal hernia   . Anxiety   . B12 deficiency   . Depression   . Glucose intolerance   . Guillain Barr syndrome ([place])   . Hypothyroidism     Past Surgical History:  Procedure Laterality Date  . ABDOMINAL SURGERY    . COLONOSCOPY    . KYPHOPLASTY  05/24/2015    The following portions of the patient's history were reviewed and updated as appropriate: allergies, current medications, past family history, past medical history, past social history, past  surgical history and problem list.   Health Maintenance:   Last pap: unknown  Last mammogram:  Reportedly normal earlier this year   Review of Systems:  Pertinent items noted in HPI and remainder of comprehensive ROS otherwise negative.  Physical Exam:  BP 112/78   Pulse 93   Ht 5\' 2"  (1.575 m)   Wt 178 lb (80.7 kg)   BMI 32.56 kg/m  CONSTITUTIONAL: Well-developed, well-nourished female in no acute distress.  HEENT:  Normocephalic, atraumatic. External right and left ear normal. No scleral icterus.  NECK: Normal range of motion, supple, no masses noted on observation SKIN: No rash noted. Not diaphoretic. No erythema. No pallor. MUSCULOSKELETAL: Normal range of motion. No edema noted. NEUROLOGIC: Alert and oriented to person, place, and time. Normal muscle tone coordination.  PSYCHIATRIC: Normal mood and affect. Normal behavior. Normal judgment and thought content. RESPIRATORY: Effort normal, no problems with respiration noted ABDOMEN: large ventral hernia ~20 cm in size extending from midline to L abdomen PELVIC: Atrophy and dryness of external genitalia; dryness and atrophy of vaginal mucosa, normal appearance of cervix.  No abnormal discharge noted.  Normal uterine size, no adnexal tenderness though some fullness appreciated on the L side.  Labs and Imaging No results found for this or any previous visit (from the past 168 hour(s)). No results found.    Assessment and Plan:   Problem  List Items Addressed This Visit      Genitourinary   Vaginal dryness, menopausal    Significant dryness noted on labial and vaginal exam. No obvious signs of sclerotic disease requiring biopsy, ctm. Not interested in hormonal therapy.         Other   Well woman exam    Cancer screening: - Pap: collected today - Mammogram: will request records - Colonoscopy: reports recently normal  Contraception: n/a  Mood: good  Vaccinations: Defer to PCP  Metabolic: Defer to PCP       History of benign ovarian tumor    Some fullness felt in LLQ, may be due to large hernia but given history will send for pelvic US, follow up pending results.       Relevant Orders   US Pelvis Complete    Other Visit Diagnoses    Screening for cervical cancer    -  Primary   Relevant Orders   Cytology - PAP( [place])      Routine preventative health maintenance measures emphasized. Please refer to After Visit Summary for other counseling recommendations.   Return in about 1 year (around 07/17/2021) for Annual Wellness Visit.    Total face-to-face time with patient: 30 minutes.  Over 50% of encounter was spent on counseling and coordination of care.   Clarnce Flock, MD/[facility], [place]

## 2020-07-18 ENCOUNTER — Telehealth: Payer: Self-pay | Admitting: Family Medicine

## 2020-07-18 LAB — CYTOLOGY - PAP: Diagnosis: NEGATIVE

## 2020-07-18 NOTE — Telephone Encounter (Signed)
Please let patient know her pap smear was normal.  Depending on the records we are able to obtain she may not need another one, but if we have no records we can repeat in 3 years.

## 2020-07-25 ENCOUNTER — Other Ambulatory Visit: Payer: Self-pay

## 2020-07-25 ENCOUNTER — Other Ambulatory Visit: Payer: Self-pay | Admitting: Family Medicine

## 2020-07-25 ENCOUNTER — Ambulatory Visit (HOSPITAL_BASED_OUTPATIENT_CLINIC_OR_DEPARTMENT_OTHER)
Admission: RE | Admit: 2020-07-25 | Discharge: 2020-07-25 | Disposition: A | Discharge status: Home / Self Care | Payer: Medicare PPO | Source: Ambulatory Visit | Attending: Family Medicine | Admitting: Family Medicine

## 2020-07-25 DIAGNOSIS — Z86018 Personal history of other benign neoplasm: Secondary | ICD-10-CM

## 2020-07-28 NOTE — Telephone Encounter (Signed)
Attempted to call patient, no answer.   Please also let patient know that her TVUS was normal, with the exception of a thickened endometrium at 8 mm. Normally we'd expect it to be less than 32mm. Since she has not had any vaginal bleeding for many years, this is likely an incidental finding. However, rarely sometimes a thickened endometrial lining can be an early sign of uterine cancer. This is unlikely, but depending on how worried she is we can do one of three things:  1. We can do an endometrial biopsy in the office to see if there are any abnormal cells 2. We can repeat the ultrasound in a couple of months to see if there's any change 3. We can do no further testing, and just monitor for signs of bleeding  Any of these options is reasonable.

## 2020-08-08 ENCOUNTER — Telehealth: Payer: Self-pay

## 2020-08-08 NOTE — Telephone Encounter (Signed)
Call patient to make sure she was aware of her test results. No answer or voicemail to leave a message.

## 2020-08-12 ENCOUNTER — Other Ambulatory Visit: Payer: Self-pay

## 2020-08-15 NOTE — Telephone Encounter (Signed)
Results and recommendations given. Pt requests to call back with her decision.

## 2020-10-14 ENCOUNTER — Ambulatory Visit (INDEPENDENT_AMBULATORY_CARE_PROVIDER_SITE_OTHER): Payer: Medicare PPO | Admitting: Ophthalmology

## 2020-10-14 ENCOUNTER — Other Ambulatory Visit: Payer: Self-pay

## 2020-10-14 ENCOUNTER — Encounter (INDEPENDENT_AMBULATORY_CARE_PROVIDER_SITE_OTHER): Payer: Self-pay | Admitting: Ophthalmology

## 2020-10-14 DIAGNOSIS — H35372 Puckering of macula, left eye: Secondary | ICD-10-CM | POA: Diagnosis not present

## 2020-10-14 DIAGNOSIS — H04123 Dry eye syndrome of bilateral lacrimal glands: Secondary | ICD-10-CM

## 2020-10-14 NOTE — Progress Notes (Signed)
10/14/2020     CHIEF COMPLAINT Patient presents for  Chief Complaint  Patient presents with   Retina Follow Up      HISTORY OF PRESENT ILLNESS: Morgan Juarez is a 65 y.o. female who presents to the clinic today for:   HPI     Retina Follow Up           Diagnosis: ERM   Laterality: left eye   Onset: 1 year ago   Severity: moderate   Duration: 1 year   Course: stable         Comments   1 yr fu ou oct Patient states vision is stable and unchanged since last visit. Denies any new floaters or FOL.       Last edited by Laurin Coder, COA on 10/14/2020  1:06 PM.      Referring physician: No referring provider defined for this encounter.  HISTORICAL INFORMATION:   Selected notes from the MEDICAL RECORD NUMBER       CURRENT MEDICATIONS: Current Outpatient Medications (Ophthalmic Drugs)  Medication Sig   carboxymethylcellulose (REFRESH PLUS) 0.5 % SOLN 1 Drop PRN for Discomfort.   cycloSPORINE (RESTASIS) 0.05 % ophthalmic emulsion    No current facility-administered medications for this visit. (Ophthalmic Drugs)   Current Outpatient Medications (Other)  Medication Sig   acetaminophen (TYLENOL) 325 MG tablet Take 2 tablets (650 mg total) by mouth every 6 (six) hours as needed for mild pain or headache.   acyclovir 655 mg in dextrose 5 % 100 mL Inject 655 mg into the vein every 8 (eight) hours. (Patient not taking: Reported on 07/17/2020)   amoxicillin (AMOXIL) 500 MG capsule Take 4 capsules by mouth one hour prior to dental procedure for prophylaxis given indwelling catheter. (Patient not taking: Reported on 07/17/2020)   Calcium Carb-Cholecalciferol 500-400 MG-UNIT CHEW  1 tablet, CHEWED, BID (2 times a day), 180 tablet, 0 Refill(s), Activate Med (Rx)   calcium carbonate (OS-CAL - DOSED IN MG OF ELEMENTAL CALCIUM) 1250 (500 Ca) MG tablet Take 1 tablet (500 mg of elemental calcium total) by mouth 3 (three) times daily with meals.   Calcium Polycarbophil (FIBER)  625 MG TABS Take by mouth.   Chlorhexidine Gluconate Cloth 2 % PADS Apply 6 each topically daily. (Patient not taking: Reported on 07/17/2020)   cholecalciferol (VITAMIN D) 1000 units tablet Take 1,000 Units by mouth daily.   clobetasol ointment (TEMOVATE) 0.05 % Apply topically.   clotrimazole (LOTRIMIN) 1 % cream Apply topically.   cyanocobalamin 1000 MCG tablet Take by mouth.   dapsone 100 MG tablet Take by mouth. (Patient not taking: Reported on 07/17/2020)   DEXILANT 60 MG capsule Take 1 capsule by mouth daily. (Patient not taking: Reported on 07/17/2020)   Docusate Sodium (DSS) 100 MG CAPS Take by mouth.   DULoxetine (CYMBALTA) 30 MG capsule Take 1 capsule (30 mg total) by mouth daily.   DULoxetine (CYMBALTA) 60 MG capsule Take 60 mg by mouth daily.   DULoxetine (CYMBALTA) 60 MG capsule Take 1 capsule (60 mg total) by mouth daily.   famotidine (PEPCID) 40 MG tablet Take 40 mg by mouth at bedtime.   fluconazole (DIFLUCAN) 200 MG tablet Take by mouth.   fluconazole (DIFLUCAN) 400-0.9 MG/200ML-% IVPB Inject 200 mLs (400 mg total) into the vein daily.   gabapentin (NEURONTIN) 400 MG capsule Take 2 capsules (800 mg total) by mouth 2 (two) times daily.   levETIRAcetam (KEPPRA) 500 MG tablet Take by mouth. (Patient not taking:  Reported on 07/17/2020)   levofloxacin (LEVAQUIN) 750 MG/150ML SOLN Inject 150 mLs (750 mg total) into the vein daily. (Patient not taking: Reported on 07/17/2020)   levothyroxine (SYNTHROID, LEVOTHROID) 50 MCG tablet Take 1 tablet (50 mcg total) by mouth daily before breakfast.   lidocaine (XYLOCAINE) 2 % solution Use as directed 15 mLs in the mouth or throat as needed for Pain (Swish, hold, and spit as needed for mouth sores/pain.).   linaclotide (LINZESS) 145 MCG CAPS capsule Take 145 mcg by mouth daily before breakfast.   Liraglutide -Weight Management 18 MG/3ML SOPN Inject 3 mg into the skin daily. (Patient not taking: Reported on 07/17/2020)   magnesium oxide (MAG-OX) 400  MG tablet Take by mouth.   Melatonin 5 MG CAPS 1 TO 2 TABLET BY MOUTH AT BEDTIME   morphine (MS CONTIN) 15 MG 12 hr tablet Take by mouth.   Multiple Vitamin (QUINTABS) TABS Take 1 tablet by mouth daily.   Multiple Vitamins-Iron (MULTIVITAMINS WITH IRON) TABS tablet Take 1 tablet by mouth daily.   Naldemedine Tosylate (SYMPROIC) 0.2 MG TABS Take 1 tablet by mouth daily. (Patient not taking: Reported on 07/17/2020)   naloxone (NARCAN) nasal spray 4 mg/0.1 mL    nystatin cream (MYCOSTATIN) APPLY TO AFFECTED AREA TWICE A DAY   ondansetron (ZOFRAN) 4 MG/2ML SOLN injection Inject 2 mLs (4 mg total) into the vein every 6 (six) hours as needed for nausea or vomiting.   oxyCODONE (OXY IR/ROXICODONE) 5 MG immediate release tablet Take by mouth.   oxymorphone (OPANA ER) 10 MG 12 hr tablet Take 10 mg by mouth every 12 (twelve) hours as needed for pain. (Patient not taking: Reported on 07/17/2020)   pantoprazole (PROTONIX) 40 MG tablet Take by mouth.   polyethylene glycol (MIRALAX / GLYCOLAX) packet Take 17 g by mouth 2 (two) times daily.   potassium chloride (MICRO-K) 10 MEQ CR capsule Take by mouth.   promethazine (PHENERGAN) 12.5 MG tablet Take 12.5 mg by mouth every 4 (four) hours as needed (motion sickness).   sodium chloride 0.9 % infusion Inject 250 mLs into the vein as needed (for IV line care  (Saline / Heparin Lock)).   sodium chloride 0.9 % infusion Inject 1,000 mLs into the vein continuous.   sodium chloride flush (NS) 0.9 % SOLN Inject 3 mLs into the vein every 12 (twelve) hours.   sodium chloride flush (NS) 0.9 % SOLN Inject 3 mLs into the vein as needed.   sodium chloride flush (NS) 0.9 % SOLN 10-40 mLs by Intracatheter route every 12 (twelve) hours.   sodium chloride flush (NS) 0.9 % SOLN 10-40 mLs by Intracatheter route as needed (flush).   sodium phosphate (FLEET) 7-19 GM/118ML ENEM Place 133 mLs (1 enema total) rectally daily as needed for severe constipation.    sulfamethoxazole-trimethoprim (BACTRIM DS,SEPTRA DS) 800-160 MG tablet Take 1 tablet by mouth 3 (three) times a week.   topiramate (TOPAMAX) 25 MG tablet Take 1 tablet (25 mg total) by mouth 2 (two) times daily. (Patient not taking: Reported on 07/17/2020)   traMADol (ULTRAM) 50 MG tablet Take 1 tablet by mouth every 8 (eight) hours as needed. (Patient not taking: Reported on 07/17/2020)   triamcinolone (KENALOG) 0.1 % 1 time daily as needed. (Patient not taking: Reported on 07/17/2020)   valACYclovir (VALTREX) 500 MG tablet Take by mouth.   No current facility-administered medications for this visit. (Other)      REVIEW OF SYSTEMS:    ALLERGIES Allergies  Allergen Reactions  Fluogen [Influenza Virus Vaccine]     States cannot have flu vaccine   Sulfamethoxazole-Trimethoprim Rash    PAST MEDICAL HISTORY Past Medical History:  Diagnosis Date   Abdominal hernia    Anxiety    B12 deficiency    Depression    Glucose intolerance    Guillain Barr syndrome (HCC)    Hypothyroidism    Past Surgical History:  Procedure Laterality Date   ABDOMINAL SURGERY     COLONOSCOPY     KYPHOPLASTY  05/24/2015    FAMILY HISTORY Family History  Problem Relation Age of Onset   Intracerebral hemorrhage Mother    CAD Father    Skin cancer Father     SOCIAL HISTORY Social History   Tobacco Use   Smoking status: Former    Types: Cigarettes    Quit date: 02/22/2013    Years since quitting: 7.6   Smokeless tobacco: Never  Substance Use Topics   Alcohol use: No   Drug use: No         OPHTHALMIC EXAM:  Base Eye Exam     Visual Acuity (ETDRS)       Right Left   Dist [place] 20/20 20/20 -1         Tonometry (Tonopen, 1:11 PM)       Right Left   Pressure 9 5         Pupils       Pupils Dark Light Shape React APD   Right PERRL 3 3 Round Minimal None   Left PERRL 3 3 Round Minimal None         Visual Fields (Counting fingers)       Left Right    Full Full          Extraocular Movement       Right Left    Full Full         Neuro/Psych     Oriented x3: Yes   Mood/Affect: Normal         Dilation     Both eyes: 1.0% Mydriacyl, 2.5% Phenylephrine @ 1:11 PM           Slit Lamp and Fundus Exam     External Exam       Right Left   External Normal Normal         Slit Lamp Exam       Right Left   Lids/Lashes Normal Normal   Conjunctiva/Sclera White and quiet White and quiet   Cornea Clear Clear   Anterior Chamber Deep and quiet Deep and quiet   Iris Round and reactive Round and reactive   Lens Centered posterior chamber intraocular lens Centered posterior chamber intraocular lens   Anterior Vitreous Normal Normal         Fundus Exam       Right Left   Posterior Vitreous Normal Normal   Disc Normal Normal   C/D Ratio 0.25 0.25   Macula Normal Epiretinal membrane, mild no topo distortion   Vessels Normal, no DR Normal, no DR   Periphery Normal Normal            IMAGING AND PROCEDURES  Imaging and Procedures for 10/14/20  OCT, Retina - OU - Both Eyes       Right Eye Quality was good. Scan locations included subfoveal. Central Foveal Thickness: 256. Findings include normal foveal contour.   Left Eye Quality was good. Scan locations included subfoveal. Central Foveal Thickness: 312. Progression has  been stable. Findings include epiretinal membrane, abnormal foveal contour.   Notes Minor increased thickening in the left eye from epiretinal membrane yet no secondary pathological changes and good acuity observe             ASSESSMENT/PLAN:  Left epiretinal membrane The nature of macular pucker (epiretinal membrane ERM) was discussed with the patient as well as threshold criteria for vitrectomy surgery. I explained that in rare cases another surgery is needed to actually remove a second wrinkle should it regrow.  Most often, the epiretinal membrane and underlying wrinkled internal limiting membrane  are removed with the first surgery, to accomplish the goals.   If the operative eye is Phak[place] (natural lens still present), cataract surgery is often recommended prior to Vitrectomy. This will enable the retina surgeon to have the best view during surgery and the patient to obtain optimal results in the future. Treatment options were discussed.  I have recommended at home monitoring the near vision task in a monocular (1 eye at a time), with or without near vision glasses, to look for changes or declines in reading.  OS, minor no impact on acuity no change over the last 1 year  Dry eyes, bilateral Follow-up with [facility]     ICD-10-CM   1. Left epiretinal membrane  H35.372 OCT, Retina - OU - Both Eyes    2. Dry eyes, bilateral  H04.123       1.  OS minor epiretinal membrane minor thickening increase over the last year we will continue to monitor and follow-up in 2 years  2.  3.  Ophthalmic Meds Ordered this visit:  No orders of the defined types were placed in this encounter.      Return in about 2 years (around 10/15/2022) for DILATE OU, OCT.  There are no Patient Instructions on file for this visit.   Explained the diagnoses, plan, and follow up with the patient and they expressed understanding.  Patient expressed understanding of the importance of proper follow up care.   Clent Demark Treyshaun Keatts M.D. Diseases & Surgery of the Retina and Vitreous [facility] [place] 10/14/20     Abbreviations: M myopia (nearsighted); A astigmatism; H hyperopia (farsighted); P presbyopia; Mrx spectacle prescription;  CTL contact lenses; OD right eye; OS left eye; OU both eyes  XT exotropia; ET esotropia; PEK punctate epithelial keratitis; PEE punctate epithelial erosions; DES dry eye syndrome; MGD meibomian gland dysfunction; ATs artificial tears; PFAT's preservative free artificial tears; [place] nuclear sclerotic cataract; PSC posterior subcapsular cataract; ERM epi-retinal membrane; PVD  posterior vitreous detachment; RD retinal detachment; DM diabetes mellitus; DR diabetic retinopathy; NPDR non-proliferative diabetic retinopathy; PDR proliferative diabetic retinopathy; CSME clinically significant macular edema; DME diabetic macular edema; dbh dot blot hemorrhages; CWS cotton wool spot; POAG primary open angle glaucoma; C/D cup-to-disc ratio; HVF humphrey visual field; GVF goldmann visual field; OCT optical coherence tomography; IOP intraocular pressure; BRVO Branch retinal vein occlusion; CRVO central retinal vein occlusion; CRAO central retinal artery occlusion; BRAO branch retinal artery occlusion; RT retinal tear; SB scleral buckle; PPV pars plana vitrectomy; VH Vitreous hemorrhage; PRP panretinal laser photocoagulation; IVK intravitreal kenalog; VMT vitreomacular traction; MH Macular hole;  NVD neovascularization of the disc; NVE neovascularization elsewhere; AREDS age related eye disease study; ARMD age related macular degeneration; POAG primary open angle glaucoma; EBMD epithelial/anterior basement membrane dystrophy; ACIOL anterior chamber intraocular lens; IOL intraocular lens; PCIOL posterior chamber intraocular lens; Phaco/IOL phacoemulsification with intraocular lens placement; [place] photorefractive keratectomy;  LASIK laser assisted in situ keratomileusis; HTN hypertension; DM diabetes mellitus; COPD chronic obstructive pulmonary disease

## 2020-10-14 NOTE — Assessment & Plan Note (Signed)
No detectable diabetic retinopathy 

## 2020-10-14 NOTE — Assessment & Plan Note (Signed)
Follow-up with [facility]

## 2020-10-14 NOTE — Assessment & Plan Note (Signed)
The nature of macular pucker (epiretinal membrane ERM) was discussed with the patient as well as threshold criteria for vitrectomy surgery. I explained that in rare cases another surgery is needed to actually remove a second wrinkle should it regrow.  Most often, the epiretinal membrane and underlying wrinkled internal limiting membrane are removed with the first surgery, to accomplish the goals.   If the operative eye is Phakic (natural lens still present), cataract surgery is often recommended prior to Vitrectomy. This will enable the retina surgeon to have the best view during surgery and the patient to obtain optimal results in the future. Treatment options were discussed.  I have recommended at home monitoring the near vision task in a monocular (1 eye at a time), with or without near vision glasses, to look for changes or declines in reading.  OS, minor no impact on acuity no change over the last 1 year

## 2020-10-22 ENCOUNTER — Other Ambulatory Visit: Payer: Self-pay | Admitting: General Surgery

## 2020-10-22 DIAGNOSIS — K432 Incisional hernia without obstruction or gangrene: Secondary | ICD-10-CM

## 2020-11-10 ENCOUNTER — Inpatient Hospital Stay
Admission: RE | Admit: 2020-11-10 | Discharge: 2020-11-10 | Disposition: A | Discharge status: Home / Self Care | Payer: Medicare PPO | Source: Ambulatory Visit | Attending: General Surgery | Admitting: General Surgery

## 2020-11-11 ENCOUNTER — Telehealth: Payer: Self-pay

## 2020-11-11 NOTE — Telephone Encounter (Signed)
Spoke to [place], Therapist, sports at [facility] who gave a verbal order, per Dr. Linus Orn, for RN at [facility] [place] to access, flush with saline, flush with heparin and to de-access the patients port for her appointment with [place] on 11/19/20. Conard Novak, RN at [facility] was the telephone witness. Misty, RN stated the patients port was last used in June but stated that would be okay to use per their policy. She also requested [place] to access and flush the patients other lumen of her port while she is at our facility. See orders.

## 2020-11-19 ENCOUNTER — Ambulatory Visit
Admission: RE | Admit: 2020-11-19 | Discharge: 2020-11-19 | Disposition: A | Discharge status: Home / Self Care | Payer: Medicare PPO | Source: Ambulatory Visit | Attending: General Surgery | Admitting: General Surgery

## 2020-11-19 ENCOUNTER — Other Ambulatory Visit: Payer: Self-pay

## 2020-11-19 DIAGNOSIS — K432 Incisional hernia without obstruction or gangrene: Secondary | ICD-10-CM

## 2020-11-19 MED ORDER — IOPAMIDOL (ISOVUE-300) INJECTION 61%
100.0000 mL | Freq: Once | INTRAVENOUS | Status: AC | PRN
  Administered 2020-11-19: 100 mL via INTRAVENOUS

## 2020-11-19 MED ORDER — SODIUM CHLORIDE 0.9% FLUSH
10.0000 mL | INTRAVENOUS | Status: DC | PRN
  Administered 2020-11-19 (×2): 10 mL via INTRAVENOUS

## 2020-11-19 MED ORDER — HEPARIN SOD (PORK) LOCK FLUSH 100 UNIT/ML IV SOLN
500.0000 [IU] | Freq: Once | INTRAVENOUS | Status: AC
  Administered 2020-11-19 (×2): 500 [IU] via INTRAVENOUS

## 2021-04-03 ENCOUNTER — Other Ambulatory Visit: Payer: Medicare PPO

## 2021-09-14 ENCOUNTER — Ambulatory Visit: Payer: Medicare PPO | Admitting: Internal Medicine

## 2021-09-14 ENCOUNTER — Encounter: Payer: Self-pay | Admitting: Internal Medicine

## 2021-09-14 VITALS — BP 104/68 | HR 79 | Ht 62.0 in | Wt 189.2 lb

## 2021-09-14 DIAGNOSIS — R7989 Other specified abnormal findings of blood chemistry: Secondary | ICD-10-CM

## 2021-09-14 DIAGNOSIS — E039 Hypothyroidism, unspecified: Secondary | ICD-10-CM | POA: Diagnosis not present

## 2021-09-14 DIAGNOSIS — D3500 Benign neoplasm of unspecified adrenal gland: Secondary | ICD-10-CM

## 2021-09-14 LAB — BASIC METABOLIC PANEL
BUN: 23 mg/dL (ref 6–23)
CO2: 32 mEq/L (ref 19–32)
Calcium: 9.5 mg/dL (ref 8.4–10.5)
Chloride: 100 mEq/L (ref 96–112)
Creatinine, Ser: 1.17 mg/dL (ref 0.40–1.20)
GFR: 48.18 mL/min — ABNORMAL LOW (ref >60.00)
Glucose, Bld: 89 mg/dL (ref 70–99)
Potassium: 4.5 mEq/L (ref 3.5–5.1)
Sodium: 139 mEq/L (ref 135–145)

## 2021-09-14 LAB — CORTISOL: Cortisol, Plasma: 4.6 ug/dL

## 2021-09-14 LAB — TSH: TSH: 5.34 u[IU]/mL (ref 0.35–5.50)

## 2021-09-14 NOTE — Progress Notes (Unsigned)
Name: Morgan Juarez  MRN/ DOB: [id number], September 26, 1955    Age/ Sex: 66 y.o., female    PCP: Simona Huh, NP   Reason for Endocrinology Evaluation: Hypothyroidism     Date of Initial Endocrinology Evaluation: 09/14/2021     HPI: Morgan Juarez is a 66 y.o. 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 The patient presented for initial endocrinology clinic visit on 09/14/2021 for consultative assistance with her Hypothyroidism.    During evaluation for hernia repair, the patient has been noted with bilateral adrenal nodules in 2017, with a CT scan of abdomen showing a right thyroid nodule 2.6 cm and a left thyroid nodule of 1.7 cm.  She was seen by an endocrinologist at [facility] in January 2017   In review of old records the patient has been noted to have suppressed ACTH, cortisol level of 3 UG/DL, normal aldosterone and catecholamines Per endocrinology note she did not suppress with dexamethasone suppression test as there is also a centimeter dexamethasone suppression test.patient was diagnosed with subclinical Cushing disease given suppressed ACTH.  She was started on mifepristone 300 mg twice daily, her ACTH had improved with a 10 LB weight loss at the time.  She was referred to [facility] for adrenal [facility]   June follow-up with her endocrinologist in May 2017 she was noted with elevated TSH while on mifepristone.,  TSH elevation has been attributed to mifepristone.   Patient follows with [facility] for weight management Patient follows with [facility] for depression Patient also follows with hematology/ oncology for B-cell acute lymphoblastic leukemia, she has been through multiple chemotherapeutic agents since 2018.  She receives IVIG infusions  She is S/P ovarian sx 2015 requiring ostomy bag, which was removed in 2016 Hx of Guillain  Barre syndrome   She was seeing endo in 2016 for adrenal adenoma and was treated for cushing syndrome, she was on  experimental study for Mifepristone, from there she was referred to [facility] and it was deems that she has no cushing disease    She is in remission from leukemia   She was diagnosed with hypothyroidism in 2014/2015  No biotin use  No hx of radiation or sx to the neck    Adrenal incidentaloma: Substantial weight gain- yes , follows with weight loss clinic  Centripetal obesity- yes  Severe hypertension- no  DM- yes Proximal muscle weakness-no Weight loss-yes- intentional  Anxiety attacks- yes Sweating- yes Cardiac arrhythmias- Palpitations- yes Fluid retention- yes  Hypokalemia-yes   Has unsteady gait with  pounding pain in hands , has neuropathy  No recent prednisone pills   Levothyroxine 50 mcg daily      In review of her old records : 03/24/2021 ACTH < 5 pg/mL  Aldo 9.6  Cortisol 3 ug/dL DHEAS 21 Metanephrine plasma <0.10 Renin 0.1  HISTORY:  Past Medical History:  Past Medical History:  Diagnosis Date   Abdominal hernia    Anxiety    B12 deficiency    Depression    Glucose intolerance    Guillain Barr syndrome ([place])    Hypothyroidism    Past Surgical History:  Past Surgical History:  Procedure Laterality Date   ABDOMINAL SURGERY     COLONOSCOPY     KYPHOPLASTY  05/24/2015    Social History:  reports that she quit smoking about 8 years ago. Her smoking use included cigarettes. She  has never used smokeless tobacco. She reports that she does not drink alcohol and does not use drugs. Family History: family history includes CAD in her father; Intracerebral hemorrhage in her mother; Skin cancer in her father.   HOME MEDICATIONS: Allergies as of 09/14/2021       Reactions   Fluogen [influenza Virus Vaccine]    States cannot have flu vaccine   Sulfamethoxazole-trimethoprim Rash        Medication List        Accurate as of September 14, 2021  9:42 AM. If you have any questions, ask your nurse or doctor.          STOP taking these medications    amoxicillin 500 MG capsule Commonly known as: AMOXIL Stopped by: Dorita Sciara, MD   dapsone 100 MG tablet Stopped by: Dorita Sciara, MD   fluconazole 200 MG tablet Commonly known as: DIFLUCAN Stopped by: Dorita Sciara, MD   fluconazole 400-0.9 MG/200ML-% IVPB Commonly known as: DIFLUCAN Stopped by: Dorita Sciara, MD   levofloxacin 750 MG/150ML Soln Commonly known as: LEVAQUIN Stopped by: Dorita Sciara, MD   linaclotide 145 MCG Caps capsule Commonly known as: Market researcher Stopped by: Dorita Sciara, MD   pantoprazole 40 MG tablet Commonly known as: PROTONIX Stopped by: Dorita Sciara, MD   sulfamethoxazole-trimethoprim 800-160 MG tablet Commonly known as: BACTRIM DS Stopped by: Dorita Sciara, MD   triamcinolone cream 0.1 % Commonly known as: KENALOG Stopped by: Dorita Sciara, MD       TAKE these medications    acetaminophen 325 MG tablet Commonly known as: TYLENOL Take 2 tablets (650 mg total) by mouth every 6 (six) hours as needed for mild pain or headache.   acyclovir 655 mg in dextrose 5 % 100 mL Inject 655 mg into the vein every 8 (eight) hours.   Calcium Carb-Cholecalciferol 500-400 MG-UNIT Chew 1 tablet, CHEWED, BID (2 times a day), 180 tablet, 0 Refill(s), Activate Med (Rx)   calcium carbonate 1250 (500 Ca) MG tablet Commonly known as: OS-CAL - dosed in mg of elemental calcium Take 1 tablet (500 mg of elemental calcium total) by mouth 3 (three) times daily with meals.   carboxymethylcellulose 0.5 % Soln Commonly known as: REFRESH PLUS 1 Drop PRN for Discomfort.   Chlorhexidine Gluconate Cloth 2 % Pads Apply 6 each topically daily.   chlorthalidone 25 MG tablet Commonly known as: HYGROTON Take 25 mg by mouth as needed.   cholecalciferol 1000 units tablet Commonly known as:  VITAMIN D Take 1,000 Units by mouth daily.   clobetasol ointment 0.05 % Commonly known as: TEMOVATE Apply topically.   clotrimazole 1 % cream Commonly known as: LOTRIMIN Apply topically.   cyanocobalamin 1000 MCG tablet Take by mouth.   Dexilant 60 MG capsule Generic drug: dexlansoprazole Take 1 capsule by mouth daily.   DSS 100 MG Caps Take by mouth.   DULoxetine 60 MG capsule Commonly known as: CYMBALTA Take 60 mg by mouth daily.   DULoxetine 30 MG capsule Commonly known as: CYMBALTA Take 1 capsule (30 mg total) by mouth daily.   DULoxetine 60 MG capsule Commonly known as: CYMBALTA Take 1 capsule (60 mg total) by mouth daily.   famotidine 40 MG tablet Commonly known as: PEPCID Take 40 mg by mouth at bedtime.   Fiber 625 MG Tabs Take by mouth.   gabapentin 400 MG capsule Commonly known as: NEURONTIN Take 2 capsules (800 mg total) by mouth  2 (two) times daily.   levETIRAcetam 500 MG tablet Commonly known as: KEPPRA Take by mouth.   levothyroxine 50 MCG tablet Commonly known as: SYNTHROID Take 1 tablet (50 mcg total) by mouth daily before breakfast.   lidocaine 2 % solution Commonly known as: XYLOCAINE Use as directed 15 mLs in the mouth or throat as needed for Pain (Swish, hold, and spit as needed for mouth sores/pain.).   Liraglutide -Weight Management 18 MG/3ML Sopn Inject 3 mg into the skin daily.   magnesium oxide 400 MG tablet Commonly known as: MAG-OX Take by mouth.   Melatonin 5 MG Caps 1 TO 2 TABLET BY MOUTH AT BEDTIME   morphine 15 MG 12 hr tablet Commonly known as: MS CONTIN Take by mouth.   MucositisRx Pack   multivitamins with iron Tabs tablet Take 1 tablet by mouth daily.   naloxone 4 MG/0.1ML Liqd nasal spray kit Commonly known as: NARCAN   nystatin cream Commonly known as: MYCOSTATIN APPLY TO AFFECTED AREA TWICE A DAY   ondansetron 4 MG/2ML Soln injection Commonly known as: ZOFRAN Inject 2 mLs (4 mg total) into the  vein every 6 (six) hours as needed for nausea or vomiting.   oxyCODONE 5 MG immediate release tablet Commonly known as: Oxy IR/ROXICODONE Take by mouth.   oxymorphone 10 MG 12 hr tablet Commonly known as: OPANA ER Take 10 mg by mouth every 12 (twelve) hours as needed for pain.   polyethylene glycol 17 g packet Commonly known as: MIRALAX / GLYCOLAX Take 17 g by mouth 2 (two) times daily.   potassium chloride 10 MEQ CR capsule Commonly known as: MICRO-K Take by mouth.   promethazine 12.5 MG tablet Commonly known as: PHENERGAN Take 12.5 mg by mouth every 4 (four) hours as needed (motion sickness).   Quintabs Tabs Take 1 tablet by mouth daily.   Restasis 0.05 % ophthalmic emulsion Generic drug: cycloSPORINE   sodium chloride 0.9 % infusion Inject 250 mLs into the vein as needed (for IV line care  (Saline / Heparin Lock)).   sodium chloride 0.9 % infusion Inject 1,000 mLs into the vein continuous.   sodium chloride flush 0.9 % Soln Commonly known as: NS Inject 3 mLs into the vein every 12 (twelve) hours.   sodium chloride flush 0.9 % Soln Commonly known as: NS Inject 3 mLs into the vein as needed.   sodium chloride flush 0.9 % Soln Commonly known as: NS 10-40 mLs by Intracatheter route every 12 (twelve) hours.   sodium chloride flush 0.9 % Soln Commonly known as: NS 10-40 mLs by Intracatheter route as needed (flush).   sodium phosphate 7-19 GM/118ML Enem Place 133 mLs (1 enema total) rectally daily as needed for severe constipation.   Symproic 0.2 MG Tabs Generic drug: Naldemedine Tosylate Take 1 tablet by mouth daily.   topiramate 25 MG tablet Commonly known as: TOPAMAX Take 1 tablet (25 mg total) by mouth 2 (two) times daily.   traMADol 50 MG tablet Commonly known as: ULTRAM Take 1 tablet by mouth every 8 (eight) hours as needed.   valACYclovir 500 MG tablet Commonly known as: VALTREX Take by mouth.          REVIEW OF SYSTEMS: A comprehensive  ROS was conducted with the patient and is negative except as per HPI    OBJECTIVE:  VS: BP 104/68 (BP Location: Left Arm, Patient Position: Sitting, Cuff Size: Large)   Pulse 79   Ht 5' 2"  (1.575 m)  Wt 189 lb 3.2 oz (85.8 kg)   SpO2 96%   BMI 34.61 kg/m    Wt Readings from Last 3 Encounters:  09/14/21 189 lb 3.2 oz (85.8 kg)  07/17/20 178 lb (80.7 kg)  09/30/16 188 lb 4.4 oz (85.4 kg)     EXAM: General: Pt appears well and is in NAD  Hydration: Well-hydrated with moist mucous membranes and good skin turgor  Neck: General: Supple without adenopathy. Thyroid: Thyroid size normal.  No goiter or nodules appreciated.  Lungs: Clear with good BS bilat with no rales, rhonchi, or wheezes  Heart: Auscultation: RRR.  Abdomen: Normoactive bowel sounds, soft, nontender, without masses or organomegaly palpable  Extremities:  BL LE: No pretibial edema normal ROM and strength.  Mental Status: Judgment, insight: Intact Orientation: Oriented to time, place, and person Mood and affect: No depression, anxiety, or agitation     DATA REVIEWED: 03/26/2021 TSH 4.87 uIU/mL     ASSESSMENT/PLAN/RECOMMENDATIONS:   Hypothyroidism:    Medications :  Signed electronically by: Mack Guise, MD  [facility]  [address] [place], [place] [address] Phone: [phone] FAX: [phone]   CC: Simona Huh, NP [place] [address] Phone: [phone] Fax: [phone]   Return to Endocrinology clinic as below: Future Appointments  Date Time Provider [place]  04/29/2022  1:15 PM Rankin, Clent Demark, MD RDE-RDE None

## 2021-09-14 NOTE — Patient Instructions (Signed)

## 2021-09-15 ENCOUNTER — Telehealth: Payer: Self-pay | Admitting: Internal Medicine

## 2021-09-15 MED ORDER — LEVOTHYROXINE SODIUM 75 MCG PO TABS: 75.0000 ug | ORAL_TABLET | Freq: Every day | ORAL | 3 refills | Status: DC

## 2021-09-15 NOTE — Telephone Encounter (Signed)
Please let the patient know that the thyroid function shows that she is not on enough levothyroxine.  Please asked the patient to stop levothyroxine 50 mcg and start levothyroxine 75 mcg daily   Please let her know that her blood cortisol is not elevated at this time and we will be waiting for the 24-hour urine collection     Thanks

## 2021-09-16 NOTE — Telephone Encounter (Signed)
LMTCB for results. 

## 2021-09-16 NOTE — Telephone Encounter (Signed)
Spoke with husband and he will have patient callback for results

## 2021-09-17 NOTE — Telephone Encounter (Signed)
Patient has now been informed of results and expressed understanding.

## 2021-09-22 LAB — EXTRA SPECIMEN

## 2021-09-22 LAB — ALDOSTERONE + RENIN ACTIVITY W/ RATIO
ALDO / PRA Ratio: 9.5 Ratio (ref 0.9–28.9)
Aldosterone: 4 ng/dL
Renin Activity: 0.42 ng/mL/h (ref 0.25–5.82)

## 2021-09-22 LAB — METANEPHRINES, PLASMA
Metanephrine, Free: <25 pg/mL (ref <=57)
Normetanephrine, Free: 65 pg/mL (ref <=148)
Total Metanephrines-Plasma: 65 pg/mL (ref <=205)

## 2021-09-22 LAB — ACTH: C206 ACTH: 11 pg/mL (ref 6–50)

## 2021-09-24 ENCOUNTER — Other Ambulatory Visit: Payer: Self-pay | Admitting: Internal Medicine

## 2021-09-24 ENCOUNTER — Telehealth: Payer: Self-pay | Admitting: Internal Medicine

## 2021-09-24 NOTE — Telephone Encounter (Signed)
Can you please check with Kieth Brightly or Tammy on the 24-urinary cortisol ? It was not released with the rest of her labs on the day she was here   Thanks

## 2021-09-24 NOTE — Telephone Encounter (Signed)
Morgan Juarez states that order will be released once patient brings sample back in.

## 2021-09-25 ENCOUNTER — Other Ambulatory Visit: Payer: Self-pay

## 2021-09-28 NOTE — Telephone Encounter (Signed)
Morgan Juarez spoke with patient and she has not brought the urine back and states that she will bring back sometime this week .

## 2021-10-05 ENCOUNTER — Encounter: Payer: Self-pay | Admitting: Internal Medicine

## 2021-10-05 ENCOUNTER — Other Ambulatory Visit: Payer: Self-pay

## 2021-10-12 ENCOUNTER — Other Ambulatory Visit: Payer: Self-pay | Admitting: *Deleted

## 2021-10-12 DIAGNOSIS — R600 Localized edema: Secondary | ICD-10-CM

## 2021-10-15 ENCOUNTER — Ambulatory Visit (HOSPITAL_COMMUNITY)
Admission: RE | Admit: 2021-10-15 | Discharge: 2021-10-15 | Disposition: A | Discharge status: Home / Self Care | Payer: Medicare PPO | Source: Ambulatory Visit | Attending: Vascular Surgery | Admitting: Vascular Surgery

## 2021-10-15 DIAGNOSIS — R600 Localized edema: Secondary | ICD-10-CM | POA: Diagnosis not present

## 2021-11-02 NOTE — Progress Notes (Unsigned)
[facility] [place]  ASSESSMENT / PLAN: Frida Wahlstrom is a 66 y.o. female with chronic venous insufficiency of bilateral lower extremities causing edema (C3 disease).  Venous duplex is significant for greater saphenous vein and anterior accessory saphenous vein incompetence.  Recommend compression and elevation for symptomatic relief. Counseled the patient about the benign nature of these findings.  She would like to pursue work-up of other chronic medical conditions including cardiac disease and chronic parastomal hernia.  I encouraged her to follow up in three months to discuss saphenous vein ablation if she is interested in pursuing this therapy.  She will maintain compression until that time.  CHIEF COMPLAINT: swelling in lower extremities  HISTORY OF PRESENT ILLNESS: Taya Ashbaugh is a 66 y.o. female referred to clinic for evaluation of chronic venous insufficiency.  The patient has had a difficult several year course.  She suffered from Guillain-Barr syndrome and had difficulty with weakness.  This is slowly improved and she is able to ambulate again.  Shortly after recovery she was diagnosed with acute B-cell leukemia.  He required chemotherapy including vincristine.  This is left her with neuropathic type discomfort in her legs.  She has no real sensation in her feet.  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  She does endorse some swelling in her lower extremities which is bothersome to her.  This is not terribly disabling, though her other lower extremity symptoms taken in aggregate are fairly bothersome to her.  VENOUS CLINICAL SEVERITY SCORE: 2  Past Medical History:  Diagnosis Date   Abdominal hernia    Anxiety    B12 deficiency    Depression    Glucose intolerance    Guillain Barr syndrome ([facility])    Hypothyroidism   Acute B-cell lymphoblastic leukemia (2018)  Past Surgical History:  Procedure Laterality Date   ABDOMINAL SURGERY     COLONOSCOPY     KYPHOPLASTY   05/24/2015    Family History  Problem Relation Age of Onset   Intracerebral hemorrhage Mother    CAD Father    Skin cancer Father     Social History   Socioeconomic History   Marital status: Married    Spouse name: Not on file   Number of children: Not on file   Years of education: Not on file   Highest education level: Not on file  Occupational History   Not on file  Tobacco Use   Smoking status: Former    Types: Cigarettes    Quit date: 02/22/2013    Years since quitting: 8.6   Smokeless tobacco: Never  Substance and Sexual Activity   Alcohol use: No   Drug use: No   Sexual activity: Not on file  Other Topics Concern   Not on file  Social History Narrative   Not on file   Social Determinants of Health   Financial Resource Strain: Not on file  Food Insecurity: Not on file  Transportation Needs: Not on file  Physical Activity: Not on file  Stress: Not on file  Social Connections: Not on file  Intimate Partner Violence: Not on file    Allergies  Allergen Reactions   Fluogen [Influenza Virus Vaccine]     States cannot have flu vaccine   Sulfamethoxazole-Trimethoprim Rash    Current Outpatient Medications  Medication Sig Dispense Refill   acetaminophen (TYLENOL) 325 MG tablet Take 2 tablets (650 mg total) by mouth every 6 (six) hours as needed for mild pain or headache.  acyclovir 655 mg in dextrose 5 % 100 mL Inject 655 mg into the vein every 8 (eight) hours. (Patient not taking: Reported on 07/17/2020)     Artificial Saliva (MUCOSITISRX) PACK  (Patient not taking: Reported on 09/14/2021)     Calcium Carb-Cholecalciferol 500-400 MG-UNIT CHEW  1 tablet, CHEWED, BID (2 times a day), 180 tablet, 0 Refill(s), Activate Med (Rx)     calcium carbonate (OS-CAL - DOSED IN MG OF ELEMENTAL CALCIUM) 1250 (500 Ca) MG tablet Take 1 tablet (500 mg of elemental calcium total) by mouth 3 (three) times daily with meals.     Calcium Polycarbophil (FIBER) 625 MG TABS Take by  mouth.     carboxymethylcellulose (REFRESH PLUS) 0.5 % SOLN 1 Drop PRN for Discomfort.     Chlorhexidine Gluconate Cloth 2 % PADS Apply 6 each topically daily. (Patient not taking: Reported on 07/17/2020)     chlorthalidone (HYGROTON) 25 MG tablet Take 25 mg by mouth as needed.     cholecalciferol (VITAMIN D) 1000 units tablet Take 1,000 Units by mouth daily.     clobetasol ointment (TEMOVATE) 0.05 % Apply topically. (Patient not taking: Reported on 09/14/2021)     clotrimazole (LOTRIMIN) 1 % cream Apply topically. (Patient not taking: Reported on 09/14/2021)     cyanocobalamin 1000 MCG tablet Take by mouth.     cycloSPORINE (RESTASIS) 0.05 % ophthalmic emulsion      DEXILANT 60 MG capsule Take 1 capsule by mouth daily.     Docusate Sodium (DSS) 100 MG CAPS Take by mouth.     DULoxetine (CYMBALTA) 30 MG capsule Take 1 capsule (30 mg total) by mouth daily.  3   DULoxetine (CYMBALTA) 60 MG capsule Take 60 mg by mouth daily.     DULoxetine (CYMBALTA) 60 MG capsule Take 1 capsule (60 mg total) by mouth daily.  3   famotidine (PEPCID) 40 MG tablet Take 40 mg by mouth at bedtime.     gabapentin (NEURONTIN) 400 MG capsule Take 2 capsules (800 mg total) by mouth 2 (two) times daily. (Patient taking differently: Take 300 mg by mouth 3 (three) times daily.)     levETIRAcetam (KEPPRA) 500 MG tablet Take by mouth. (Patient not taking: Reported on 07/17/2020)     levothyroxine (SYNTHROID) 75 MCG tablet Take 1 tablet (75 mcg total) by mouth daily. 90 tablet 3   lidocaine (XYLOCAINE) 2 % solution Use as directed 15 mLs in the mouth or throat as needed for Pain (Swish, hold, and spit as needed for mouth sores/pain.).     Liraglutide -Weight Management 18 MG/3ML SOPN Inject 3 mg into the skin daily. (Patient not taking: Reported on 07/17/2020)     magnesium oxide (MAG-OX) 400 MG tablet Take by mouth.     Melatonin 5 MG CAPS 1 TO 2 TABLET BY MOUTH AT BEDTIME     morphine (MS CONTIN) 15 MG 12 hr tablet Take by mouth.      Multiple Vitamins-Iron (MULTIVITAMINS WITH IRON) TABS tablet Take 1 tablet by mouth daily.     Naldemedine Tosylate (SYMPROIC) 0.2 MG TABS Take 1 tablet by mouth daily.     naloxone (NARCAN) nasal spray 4 mg/0.1 mL      nystatin cream (MYCOSTATIN) APPLY TO AFFECTED AREA TWICE A DAY     ondansetron (ZOFRAN) 4 MG/2ML SOLN injection Inject 2 mLs (4 mg total) into the vein every 6 (six) hours as needed for nausea or vomiting. 2 mL 0   oxyCODONE (OXY IR/ROXICODONE) 5  MG immediate release tablet Take by mouth.     oxymorphone (OPANA ER) 10 MG 12 hr tablet Take 10 mg by mouth every 12 (twelve) hours as needed for pain. (Patient not taking: Reported on 07/17/2020)     polyethylene glycol (MIRALAX / GLYCOLAX) packet Take 17 g by mouth 2 (two) times daily. (Patient not taking: Reported on 09/14/2021) 14 each 0   potassium chloride (MICRO-K) 10 MEQ CR capsule Take by mouth.     promethazine (PHENERGAN) 12.5 MG tablet Take 12.5 mg by mouth every 4 (four) hours as needed (motion sickness). (Patient not taking: Reported on 09/14/2021)     sodium chloride 0.9 % infusion Inject 250 mLs into the vein as needed (for IV line care  (Saline / Heparin Lock)). (Patient not taking: Reported on 09/14/2021)  0   sodium chloride 0.9 % infusion Inject 1,000 mLs into the vein continuous. (Patient not taking: Reported on 09/14/2021)  0   sodium chloride flush (NS) 0.9 % SOLN Inject 3 mLs into the vein every 12 (twelve) hours. (Patient not taking: Reported on 09/14/2021)     sodium chloride flush (NS) 0.9 % SOLN Inject 3 mLs into the vein as needed. (Patient not taking: Reported on 09/14/2021)     sodium chloride flush (NS) 0.9 % SOLN 10-40 mLs by Intracatheter route every 12 (twelve) hours. (Patient not taking: Reported on 09/14/2021)     sodium chloride flush (NS) 0.9 % SOLN 10-40 mLs by Intracatheter route as needed (flush). (Patient not taking: Reported on 09/14/2021)     sodium phosphate (FLEET) 7-19 GM/118ML ENEM Place 133 mLs (1  enema total) rectally daily as needed for severe constipation. (Patient not taking: Reported on 09/14/2021)  0   topiramate (TOPAMAX) 25 MG tablet Take 1 tablet (25 mg total) by mouth 2 (two) times daily. (Patient not taking: Reported on 07/17/2020)     traMADol (ULTRAM) 50 MG tablet Take 1 tablet by mouth every 8 (eight) hours as needed. (Patient not taking: Reported on 07/17/2020)     valACYclovir (VALTREX) 500 MG tablet Take by mouth.     No current facility-administered medications for this visit.    PHYSICAL EXAM Vitals:   11/03/21 1140  BP: 109/76  Pulse: 73  Resp: 20  Temp: 98.4 F (36.9 C)  SpO2: 98%  Weight: 186 lb (84.4 kg)  Height: '5\' 2"'$  (1.575 m)   Well appearing woman in no distress Rate and rhythm Unlabored breathing Minute veins across the pretibial skin.  Mild edema about the ankles bilaterally.  Palpable dorsalis pedis pulses bilaterally.  PERTINENT LABORATORY AND RADIOLOGIC DATA  Most recent CBC    Latest Ref Rng & Units 10/04/2016    6:43 AM 10/03/2016    5:03 AM 10/02/2016    3:39 AM  CBC  WBC 4.0 - 10.5 K/uL 0.6  0.5  0.5   Hemoglobin 12.0 - 15.0 g/dL 7.7  8.1  7.6   Hematocrit 36.0 - 46.0 % 21.4  22.6  21.0   Platelets 150 - 400 K/uL '15  12  15     '$ More recent CBC from outside hospital shows white blood cell count of 6.1.  Hemoglobin of 13.3.  platelet count of 178.  Most recent CMP    Latest Ref Rng & Units 09/14/2021   10:20 AM 10/04/2016    6:43 AM 10/03/2016    5:03 AM  CMP  Glucose 70 - 99 mg/dL 89  100  102   BUN 6 - 23  mg/dL '23  9  9   '$ Creatinine 0.40 - 1.20 mg/dL 1.17  0.44  0.40   Sodium 135 - 145 mEq/L 139  134  133   Potassium 3.5 - 5.1 mEq/L 4.5  4.2  4.7   Chloride 96 - 112 mEq/L 100  105  104   CO2 19 - 32 mEq/L 32  23  23   Calcium 8.4 - 10.5 mg/dL 9.5  7.8  7.7    Right venous reflux study:  - No evidence of deep vein thrombosis seen in the right lower extremity,  from the common femoral through the popliteal veins.  - No  evidence of superficial venous thrombosis in the right lower  extremity.     - Venous reflux is noted in the right common femoral vein.  - Venous reflux is noted in the right sapheno-femoral junction. - Venous  reflux is noted in the right Profunda femoral vein.  - Venous reflux is noted in the right greater saphenous vein in the thigh.  - Venous reflux is noted in the right short saphenous vein.  - Venous reflux is noted in the right anterior thigh accessory saphenous  vein.   Yevonne Aline. Stanford Breed, MD [facility] Phone Number: [phone] 11/02/2021 10:56 AM  Total time spent on preparing this encounter including chart review, data review, collecting history, examining the patient, coordinating care for this new patient, 45 minutes.  Portions of this report may have been transcribed using voice recognition software.  Every effort has been made to ensure accuracy; however, inadvertent computerized transcription errors may still be present.

## 2021-11-03 ENCOUNTER — Ambulatory Visit: Payer: Medicare PPO | Admitting: Vascular Surgery

## 2021-11-03 ENCOUNTER — Encounter: Payer: Self-pay | Admitting: Vascular Surgery

## 2021-11-03 VITALS — BP 109/76 | HR 73 | Temp 98.4°F | Resp 20 | Ht 62.0 in | Wt 186.0 lb

## 2021-11-03 DIAGNOSIS — I872 Venous insufficiency (chronic) (peripheral): Secondary | ICD-10-CM

## 2021-12-29 ENCOUNTER — Other Ambulatory Visit (HOSPITAL_COMMUNITY): Payer: Self-pay

## 2021-12-29 ENCOUNTER — Other Ambulatory Visit (HOSPITAL_BASED_OUTPATIENT_CLINIC_OR_DEPARTMENT_OTHER): Payer: Self-pay

## 2021-12-29 MED ORDER — SAXENDA 18 MG/3ML ~~LOC~~ SOPN
3.0000 mg | PEN_INJECTOR | Freq: Every day | SUBCUTANEOUS | 3 refills | Status: AC
  Filled 2021-12-29: qty 15, 30d supply, fill #0
  Filled 2022-02-25: qty 15, 30d supply, fill #1

## 2022-01-12 ENCOUNTER — Ambulatory Visit: Payer: Medicare PPO | Admitting: Internal Medicine

## 2022-01-12 ENCOUNTER — Encounter: Payer: Self-pay | Admitting: Internal Medicine

## 2022-01-12 VITALS — BP 106/74 | HR 64 | Ht 62.0 in | Wt 188.0 lb

## 2022-01-12 DIAGNOSIS — R7989 Other specified abnormal findings of blood chemistry: Secondary | ICD-10-CM | POA: Diagnosis not present

## 2022-01-12 DIAGNOSIS — E039 Hypothyroidism, unspecified: Secondary | ICD-10-CM

## 2022-01-12 DIAGNOSIS — D3500 Benign neoplasm of unspecified adrenal gland: Secondary | ICD-10-CM | POA: Diagnosis not present

## 2022-01-12 NOTE — Progress Notes (Unsigned)
Name: Morgan Juarez  MRN/ DOB: [id number], 1955-10-17    Age/ Sex: 66 y.o., female    PCP: Simona Huh, NP   Reason for Endocrinology Evaluation: Hypothyroidism     Date of Initial Endocrinology Evaluation: 09/14/2021    HPI: Morgan Juarez is a 66 y.o. 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 The patient presented for initial endocrinology clinic visit on 09/14/2021 for consultative assistance with her Hypothyroidism.    During evaluation for hernia repair, the patient has been noted with bilateral adrenal nodules in 2017, with a CT scan of abdomen showing a right thyroid nodule 2.6 cm and a left thyroid nodule of 1.7 cm.  She was seen by an endocrinologist at [place] in January 2017   In review of old records the patient has been noted to have suppressed ACTH, cortisol level of 3 UG/DL, normal aldosterone and catecholamines Per endocrinology note she did not suppress with dexamethasone suppression test as there is also a centimeter dexamethasone suppression test.patient was diagnosed with subclinical Cushing disease given suppressed ACTH.  She was started on mifepristone 300 mg twice daily, her ACTH had improved with a 10 LB weight loss at the time.  She was referred to [facility] for adrenal surgery   During  follow-up with her endocrinologist in May 2017 she was noted with elevated TSH while on mifepristone. TSH elevation has been attributed to mifepristone.  She is S/P ovarian sx 2015 requiring ostomy bag, which was removed in 2016 Hx of Guillain Barre syndrome   Patient follows with [facility] for weight management Patient follows with [facility] for depression Patient also follows with hematology/ oncology for B-cell acute lymphoblastic leukemia, she has been through multiple chemotherapeutic agents since 2018.  She receives  IVIG infusions    She is in remission from leukemia   In review of her old records : 03/24/2021 ACTH < 5 pg/mL  Aldo 9.6  Cortisol 3 ug/dL DHEAS 21 Metanephrine plasma <0.10 Renin 0.1  No hx of radiation or sx to the neck    On her initial visit to our clinic she had a normal ACTH at 11 pg/mL , cortisol 4.6 ug/dL, normal aldo 4 ng/dL, renin 0.42 ng/mL/hr , as well as normal aldo/renin ratio 9.5 . Normal plasma metanephrines and nor metanephrines .      SUBJECTIVE:    Today (01/12/22):  Morgan Juarez is here for a follow up on adrenal adenoma and hypothyroidism.    She was supposed to bring 24 hr urinary cortisol but has done so yet She has chronic diarrhea , follows with GI  Denies local neck swelling  She continues with weight loss clinic    No biotin use    Levothyroxine 75 mcg daily       HISTORY:  Past Medical History:  Past Medical History:  Diagnosis Date   Abdominal hernia    Anxiety    B12 deficiency    Cancer (HCC)    Depression    Glucose intolerance    Guillain Barr syndrome ([place])    Hypothyroidism    Past Surgical History:  Past Surgical History:  Procedure Laterality Date   ABDOMINAL SURGERY     COLONOSCOPY     KYPHOPLASTY  05/24/2015    Social History:  reports that she quit smoking about 8 years ago. Her smoking use included cigarettes. She has  never used smokeless tobacco. She reports that she does not drink alcohol and does not use drugs. Family History: family history includes CAD in her father; Intracerebral hemorrhage in her mother; Skin cancer in her father.   HOME MEDICATIONS: Allergies as of 01/12/2022       Reactions   Fluogen [influenza Virus Vaccine]    States cannot have flu vaccine   Sulfamethoxazole-trimethoprim Rash        Medication List        Accurate as of January 12, 2022 12:31 PM. If you have any questions, ask your nurse or doctor.          acetaminophen 325 MG tablet Commonly known as:  TYLENOL Take 2 tablets (650 mg total) by mouth every 6 (six) hours as needed for mild pain or headache.   Calcium Carb-Cholecalciferol 500-400 MG-UNIT Chew 1 tablet, CHEWED, BID (2 times a day), 180 tablet, 0 Refill(s), Activate Med (Rx)   calcium carbonate 1250 (500 Ca) MG tablet Commonly known as: OS-CAL - dosed in mg of elemental calcium Take 1 tablet (500 mg of elemental calcium total) by mouth 3 (three) times daily with meals.   carboxymethylcellulose 0.5 % Soln Commonly known as: REFRESH PLUS 1 Drop PRN for Discomfort.   chlorthalidone 25 MG tablet Commonly known as: HYGROTON Take 25 mg by mouth as needed.   cholecalciferol 1000 units tablet Commonly known as: VITAMIN D Take 1,000 Units by mouth daily.   cyanocobalamin 1000 MCG tablet Take by mouth.   Dexilant 60 MG capsule Generic drug: dexlansoprazole Take 1 capsule by mouth daily.   DSS 100 MG Caps Take by mouth.   DULoxetine 60 MG capsule Commonly known as: CYMBALTA Take 60 mg by mouth daily.   DULoxetine 30 MG capsule Commonly known as: CYMBALTA Take 1 capsule (30 mg total) by mouth daily.   DULoxetine 60 MG capsule Commonly known as: CYMBALTA Take 1 capsule (60 mg total) by mouth daily.   famotidine 40 MG tablet Commonly known as: PEPCID Take 40 mg by mouth at bedtime.   Fiber 625 MG Tabs Take by mouth.   gabapentin 400 MG capsule Commonly known as: NEURONTIN Take 2 capsules (800 mg total) by mouth 2 (two) times daily. What changed:  how much to take when to take this   Gammagard 20 GM/200ML Soln Generic drug: Immune Globulin (Human)   heparin flush 10 UNIT/ML injection Inject into the vein.   levothyroxine 75 MCG tablet Commonly known as: SYNTHROID Take 1 tablet (75 mcg total) by mouth daily.   lidocaine 2 % solution Commonly known as: XYLOCAINE Use as directed 15 mLs in the mouth or throat as needed for Pain (Swish, hold, and spit as needed for mouth sores/pain.).   magnesium oxide  400 MG tablet Commonly known as: MAG-OX Take by mouth.   Melatonin 5 MG Caps 1 TO 2 TABLET BY MOUTH AT BEDTIME   metFORMIN 500 MG 24 hr tablet Commonly known as: GLUCOPHAGE-XR Take 500 mg by mouth every morning.   morphine 15 MG 12 hr tablet Commonly known as: MS CONTIN Take by mouth.   multivitamins with iron Tabs tablet Take 1 tablet by mouth daily.   naloxone 4 MG/0.1ML Liqd nasal spray kit Commonly known as: NARCAN   nystatin cream Commonly known as: MYCOSTATIN APPLY TO AFFECTED AREA TWICE A DAY   ondansetron 4 MG/2ML Soln injection Commonly known as: ZOFRAN Inject 2 mLs (4 mg total) into the vein every 6 (six) hours as needed  for nausea or vomiting.   oxyCODONE 5 MG immediate release tablet Commonly known as: Oxy IR/ROXICODONE Take by mouth.   potassium chloride 10 MEQ CR capsule Commonly known as: MICRO-K Take by mouth.   promethazine 12.5 MG tablet Commonly known as: PHENERGAN Take 12.5 mg by mouth every 4 (four) hours as needed (motion sickness).   Restasis 0.05 % ophthalmic emulsion Generic drug: cycloSPORINE   Saxenda 18 MG/3ML Sopn Generic drug: Liraglutide -Weight Management Inject 3 mg into the skin daily.   sertraline 50 MG tablet Commonly known as: ZOLOFT Take 75 mg by mouth daily.   sodium chloride 0.9 % infusion Inject 1,000 mLs into the vein continuous.   sodium chloride flush 0.9 % Soln Commonly known as: NS Inject 3 mLs into the vein every 12 (twelve) hours.   sodium chloride flush 0.9 % Soln Commonly known as: NS Inject 3 mLs into the vein as needed.   sodium chloride flush 0.9 % Soln Commonly known as: NS 10-40 mLs by Intracatheter route as needed (flush).   Symproic 0.2 MG Tabs Generic drug: Naldemedine Tosylate Take 1 tablet by mouth daily.   torsemide 5 MG tablet Commonly known as: DEMADEX Take 5 mg by mouth daily.   valACYclovir 500 MG tablet Commonly known as: VALTREX Take by mouth.          REVIEW OF  SYSTEMS: A comprehensive ROS was conducted with the patient and is negative except as per HPI    OBJECTIVE:  VS: There were no vitals taken for this visit.   Wt Readings from Last 3 Encounters:  11/03/21 186 lb (84.4 kg)  09/14/21 189 lb 3.2 oz (85.8 kg)  07/17/20 178 lb (80.7 kg)     EXAM: General: Pt appears well and is in NAD  Hydration: Well-hydrated with moist mucous membranes and good skin turgor  Neck: General: Supple without adenopathy. Thyroid: Thyroid size normal.  No goiter or nodules appreciated.  Lungs: Clear with good BS bilat with no rales, rhonchi, or wheezes  Heart: Auscultation: RRR.  Abdomen: Normoactive bowel sounds, soft, nontender, without masses or organomegaly palpable  Extremities:  BL LE: No pretibial edema normal ROM and strength.  Mental Status: Judgment, insight: Intact Orientation: Oriented to time, place, and person Mood and affect: No depression, anxiety, or agitation     DATA REVIEWED: 03/26/2021 TSH 4.87 uIU/mL     ASSESSMENT/PLAN/RECOMMENDATIONS:   Adrenal Adenoma :   -Patient with bilateral adrenal nodules and stable from 2017 , stability was confirmed through CT imaging in 2020 -In review of her records she had normal Aldo, renin, metanephrine and catecholamine in 2017  - She was diagnosed with cushing disease with suppressed ACTH and unsuppressed cortisol with Dexamethasone suppression test in 2017, was on mifepristone  through her previous endocrinologist  - She was [facility] for surgical intervention but per pt cushing syndrome was ruled OUT -We will proceed with screening for pheochromocytoma, hyperaldosteronism, and screening for Cushing syndrome to 24-hour urinary cortisol   Hypothyroidism:  - TSH is elevated - Will increase Levothyroxine as below  - Pt educated extensively on the correct way to take levothyroxine (first thing in the morning with water, 30 minutes before eating or taking other medications). - Pt  encouraged to double dose the following day if she were to miss a dose given long half-life of levothyroxine.   Medications : Stop Levothyroxine 50 mcg daily  Start Levothyroxine 75 mcg daily      Signed electronically by: Maretta Bees  Nena Jordan, MD  [facility]  [facility] [address] [place], [place] [address] Phone: [phone] FAX: [phone]   CC: Simona Huh, NP [place] [address] Phone: [phone] Fax: [phone]   Return to Endocrinology clinic as below: Future Appointments  Date Time Provider [place]  01/12/2022  3:00 PM Mizuki Hoel, Melanie Crazier, MD [facility] None  02/03/2022  3:40 PM Angelia Mould, MD [facility]  04/29/2022  1:15 PM Rankin, Clent Demark, MD [facility] None

## 2022-01-12 NOTE — Patient Instructions (Signed)

## 2022-01-13 ENCOUNTER — Encounter: Payer: Self-pay | Admitting: Internal Medicine

## 2022-01-13 LAB — TSH: TSH: 2.72 u[IU]/mL (ref 0.35–5.50)

## 2022-01-13 MED ORDER — LEVOTHYROXINE SODIUM 75 MCG PO TABS: 75.0000 ug | ORAL_TABLET | Freq: Every day | ORAL | 3 refills | Status: AC

## 2022-01-19 ENCOUNTER — Other Ambulatory Visit (HOSPITAL_BASED_OUTPATIENT_CLINIC_OR_DEPARTMENT_OTHER): Payer: Self-pay

## 2022-01-19 MED ORDER — SAXENDA 18 MG/3ML ~~LOC~~ SOPN
3.0000 mg | PEN_INJECTOR | Freq: Every day | SUBCUTANEOUS | 0 refills | Status: AC
  Filled 2022-01-19 – 2022-01-21 (×2): qty 15, 30d supply, fill #0

## 2022-01-21 ENCOUNTER — Other Ambulatory Visit (HOSPITAL_BASED_OUTPATIENT_CLINIC_OR_DEPARTMENT_OTHER): Payer: Self-pay

## 2022-01-25 ENCOUNTER — Other Ambulatory Visit (HOSPITAL_BASED_OUTPATIENT_CLINIC_OR_DEPARTMENT_OTHER): Payer: Self-pay

## 2022-01-26 ENCOUNTER — Other Ambulatory Visit (HOSPITAL_BASED_OUTPATIENT_CLINIC_OR_DEPARTMENT_OTHER): Payer: Self-pay

## 2022-02-03 ENCOUNTER — Encounter: Payer: Self-pay | Admitting: Vascular Surgery

## 2022-02-03 ENCOUNTER — Ambulatory Visit (INDEPENDENT_AMBULATORY_CARE_PROVIDER_SITE_OTHER): Payer: Medicare PPO | Admitting: Vascular Surgery

## 2022-02-03 VITALS — BP 97/63 | HR 84 | Temp 98.0°F | Resp 16 | Ht 62.5 in | Wt 181.1 lb

## 2022-02-03 DIAGNOSIS — I872 Venous insufficiency (chronic) (peripheral): Secondary | ICD-10-CM | POA: Diagnosis not present

## 2022-02-03 NOTE — Progress Notes (Signed)
REASON FOR VISIT:   Follow-up of chronic venous insufficiency.  MEDICAL ISSUES:   CHRONIC VENOUS INSUFFICIENCY: The patient's leg swelling has improved since her last visit.  She does have some deep venous reflux involving the common femoral vein and also superficial venous reflux in the right great saphenous vein in the proximal thigh and also in the right anterior accessory saphenous vein.  However I do not think addressing the reflux here would significantly impact her swelling.  We have again discussed the importance of intermittent leg elevation and the proper positioning for this.  I encouraged her to continue to wear her compression stockings.  We discussed importance of exercise specifically walking and water aerobics.  I have encouraged her to avoid prolonged sitting and standing.  In addition we have discussed the importance of maintaining a healthy weight as central obesity especially increases lower extremity venous pressure.  If her swelling progresses or her symptoms progress then certainly we could consider laser ablation of the right anterior accessory saphenous vein.  NEUROPATHY: I do not think the burning pain in her feet can be attributed to her venous disease.  I think this is secondary to neuropathy.   HPI:   Morgan Juarez is a pleasant 66 y.o. female who was seen by Dr. Standley Dakins on 11/03/2021.  She had chronic venous insufficiency with edema (C3 venous disease).  Her venous duplex scan showed significant reflux in the great saphenous vein and anterior accessory saphenous vein.  She was instructed to wear thigh-high compression stockings with a gradient of 20 to 30 mmHg, elevate her legs, and exercise.  She comes in for 62-monthfollow-up visit.  I have reviewed the records from the referring office.  The patient was seen recently complaining that her feet were swollen and numb.  On my history today, her swelling has gotten better and she has been wearing her compression  stockings.  She does complain of some burning in her feet.  She does have a history of neuropathy felt to be secondary to [place] also from chemotherapy for her leukemia.  I do not get any clear-cut history of claudication or rest pain.  She has had no previous history of DVT.  She has had no previous venous procedures.  She has been wearing her compression stockings which help her legs some.  They have certainly helped with the swelling.  She does elevate her legs which helps also.   Past Medical History:  Diagnosis Date   Abdominal hernia    Anxiety    B12 deficiency    Cancer (HCC)    Depression    Glucose intolerance    Guillain Barr syndrome (HCC)    Hypothyroidism     Family History  Problem Relation Age of Onset   Intracerebral hemorrhage Mother    CAD Father    Skin cancer Father     SOCIAL HISTORY: Social History   Tobacco Use   Smoking status: Former    Types: Cigarettes    Quit date: 02/22/2013    Years since quitting: 8.9   Smokeless tobacco: Never  Substance Use Topics   Alcohol use: No    Allergies  Allergen Reactions   Fluogen [Influenza Virus Vaccine]     States cannot have flu vaccine   Sulfamethoxazole-Trimethoprim Rash    Current Outpatient Medications  Medication Sig Dispense Refill   acetaminophen (TYLENOL) 325 MG tablet Take 2 tablets (650 mg total) by mouth every 6 (six) hours as needed for  mild pain or headache.     Calcium Carb-Cholecalciferol 500-400 MG-UNIT CHEW  1 tablet, CHEWED, BID (2 times a day), 180 tablet, 0 Refill(s), Activate Med (Rx)     calcium carbonate (OS-CAL - DOSED IN MG OF ELEMENTAL CALCIUM) 1250 (500 Ca) MG tablet Take 1 tablet (500 mg of elemental calcium total) by mouth 3 (three) times daily with meals.     Calcium Polycarbophil (FIBER) 625 MG TABS Take by mouth.     carboxymethylcellulose (REFRESH PLUS) 0.5 % SOLN 1 Drop PRN for Discomfort.     chlorthalidone (HYGROTON) 25 MG tablet Take 25 mg by mouth as  needed.     cholecalciferol (VITAMIN D) 1000 units tablet Take 1,000 Units by mouth daily.     cyanocobalamin 1000 MCG tablet Take by mouth.     cycloSPORINE (RESTASIS) 0.05 % ophthalmic emulsion      DEXILANT 60 MG capsule Take 1 capsule by mouth daily.     Docusate Sodium (DSS) 100 MG CAPS Take by mouth.     DULoxetine (CYMBALTA) 30 MG capsule Take 1 capsule (30 mg total) by mouth daily.  3   DULoxetine (CYMBALTA) 60 MG capsule Take 1 capsule (60 mg total) by mouth daily.  3   famotidine (PEPCID) 40 MG tablet Take 40 mg by mouth at bedtime.     gabapentin (NEURONTIN) 400 MG capsule Take 2 capsules (800 mg total) by mouth 2 (two) times daily. (Patient taking differently: Take 800 mg by mouth 3 (three) times daily.)     GAMMAGARD 20 GM/200ML SOLN      Heparin Sod, Pork, Lock Flush (HEPARIN FLUSH) 10 UNIT/ML injection Inject into the vein.     levothyroxine (SYNTHROID) 75 MCG tablet Take 1 tablet (75 mcg total) by mouth daily. 90 tablet 3   lidocaine (XYLOCAINE) 2 % solution Use as directed 15 mLs in the mouth or throat as needed for Pain (Swish, hold, and spit as needed for mouth sores/pain.).     Liraglutide -Weight Management (SAXENDA) 18 MG/3ML SOPN Inject 3 mg into the skin daily. 15 mL 3   magnesium oxide (MAG-OX) 400 MG tablet Take by mouth.     Melatonin 5 MG CAPS 1 TO 2 TABLET BY MOUTH AT BEDTIME     metFORMIN (GLUCOPHAGE-XR) 500 MG 24 hr tablet Take 500 mg by mouth every morning.     morphine (MS CONTIN) 15 MG 12 hr tablet Take by mouth.     Multiple Vitamins-Iron (MULTIVITAMINS WITH IRON) TABS tablet Take 1 tablet by mouth daily.     Naldemedine Tosylate (SYMPROIC) 0.2 MG TABS Take 1 tablet by mouth daily.     naloxone (NARCAN) nasal spray 4 mg/0.1 mL      nystatin cream (MYCOSTATIN) APPLY TO AFFECTED AREA TWICE A DAY     ondansetron (ZOFRAN) 4 MG/2ML SOLN injection Inject 2 mLs (4 mg total) into the vein every 6 (six) hours as needed for nausea or vomiting. 2 mL 0   oxyCODONE (OXY  IR/ROXICODONE) 5 MG immediate release tablet Take by mouth.     potassium chloride (MICRO-K) 10 MEQ CR capsule Take by mouth.     pravastatin (PRAVACHOL) 40 MG tablet Take 40 mg by mouth daily.     promethazine (PHENERGAN) 12.5 MG tablet Take 12.5 mg by mouth every 4 (four) hours as needed (motion sickness).     SAXENDA 18 MG/3ML SOPN Inject 3 mg into the skin daily. 15 mL 0   sertraline (ZOLOFT) 50 MG tablet  Take 75 mg by mouth daily.     sodium chloride 0.9 % infusion Inject 1,000 mLs into the vein continuous.  0   sodium chloride flush (NS) 0.9 % SOLN Inject 3 mLs into the vein every 12 (twelve) hours.     sodium chloride flush (NS) 0.9 % SOLN Inject 3 mLs into the vein as needed.     sodium chloride flush (NS) 0.9 % SOLN 10-40 mLs by Intracatheter route as needed (flush).     torsemide (DEMADEX) 5 MG tablet Take 5 mg by mouth daily.     valACYclovir (VALTREX) 500 MG tablet Take by mouth.     DULoxetine (CYMBALTA) 60 MG capsule Take 60 mg by mouth daily. (Patient not taking: Reported on 02/03/2022)     No current facility-administered medications for this visit.    REVIEW OF SYSTEMS:  '[X]'$  denotes positive finding, '[ ]'$  denotes negative finding Cardiac  Comments:  Chest pain or chest pressure:    Shortness of breath upon exertion:    Short of breath when lying flat:    Irregular heart rhythm:        Vascular    Pain in calf, thigh, or hip brought on by ambulation: x   Pain in feet at night that wakes you up from your sleep:  x   Blood clot in your veins:    Leg swelling:  x       Pulmonary    Oxygen at home:    Productive cough:     Wheezing:         Neurologic    Sudden weakness in arms or legs:     Sudden numbness in arms or legs:     Sudden onset of difficulty speaking or slurred speech:    Temporary loss of vision in one eye:     Problems with dizziness:  x       Gastrointestinal    Blood in stool:     Vomited blood:         Genitourinary    Burning when  urinating:     Blood in urine:        Psychiatric    Major depression:         Hematologic    Bleeding problems:    Problems with blood clotting too easily:        Skin    Rashes or ulcers:        Constitutional    Fever or chills:     PHYSICAL EXAM:   Vitals:   02/03/22 1610  BP: 97/63  Pulse: 84  Resp: 16  Temp: 98 F (36.7 C)  TempSrc: Temporal  SpO2: 97%  Weight: 181 lb 1.6 oz (82.1 kg)  Height: 5' 2.5" (1.588 m)    GENERAL: The patient is a well-nourished female, in no acute distress. The vital signs are documented above. CARDIAC: There is a regular rate and rhythm.  VASCULAR: I do not detect carotid bruits. She has a biphasic dorsalis pedis and posterior tibial signals bilaterally. I did look at her right great saphenous vein and anterior accessory saphenous vein myself with the SonoSite.  She does have reflux in the great saphenous vein in the proximal thigh however the vein quickly branches and becomes small.  However the anterior accessory saphenous vein does have reflux and is about 5 mm in diameter.  Technically this would certainly be feasible. PULMONARY: There is good air exchange bilaterally without wheezing or rales. ABDOMEN: Soft  and non-tender with normal pitched bowel sounds.  MUSCULOSKELETAL: There are no major deformities or cyanosis. NEUROLOGIC: No focal weakness or paresthesias are detected. SKIN: There are no ulcers or rashes noted. PSYCHIATRIC: The patient has a normal affect.  DATA:    VENOUS DUPLEX: I have reviewed the venous duplex scan that was done on 10/15/2021.  This was of the right lower extremity only.  There was no evidence of DVT.  There was deep venous reflux involving the common femoral vein.  There was superficial venous reflux in the right great saphenous vein at the saphenofemoral junction and in the proximal thigh.  The vein was approximately 6 mm in diameter in the proximal thigh.  There was also reflux in the anterior accessory  saphenous vein on the right and the vein was dilated up to approximately 5 mm.  The results of this test are summarized on the diagram below.    A total of 41 minutes was spent on this visit. 22 minutes was face to face time. More than 50% of the time was spent on counseling and coordinating with the patient.    Deitra Mayo [facility] [phone]

## 2022-02-08 ENCOUNTER — Other Ambulatory Visit (HOSPITAL_BASED_OUTPATIENT_CLINIC_OR_DEPARTMENT_OTHER): Payer: Self-pay

## 2022-02-08 MED ORDER — SAXENDA 18 MG/3ML ~~LOC~~ SOPN: 3.0000 mg | PEN_INJECTOR | Freq: Every day | SUBCUTANEOUS | 3 refills | Status: AC

## 2022-02-08 MED ORDER — PHENTERMINE HCL 15 MG PO CAPS
15.0000 mg | ORAL_CAPSULE | Freq: Every day | ORAL | 0 refills | Status: DC
  Filled 2022-02-08: qty 30, 30d supply, fill #0

## 2022-02-08 MED ORDER — DULOXETINE HCL 30 MG PO CPEP
30.0000 mg | ORAL_CAPSULE | Freq: Every evening | ORAL | 1 refills | Status: AC
  Filled 2022-02-08: qty 90, 90d supply, fill #0

## 2022-02-10 ENCOUNTER — Ambulatory Visit: Payer: Medicare PPO | Admitting: Vascular Surgery

## 2022-02-12 ENCOUNTER — Other Ambulatory Visit (HOSPITAL_BASED_OUTPATIENT_CLINIC_OR_DEPARTMENT_OTHER): Payer: Self-pay

## 2022-02-24 ENCOUNTER — Other Ambulatory Visit (HOSPITAL_BASED_OUTPATIENT_CLINIC_OR_DEPARTMENT_OTHER): Payer: Self-pay

## 2022-02-24 MED ORDER — MORPHINE SULFATE ER 15 MG PO TBCR
15.0000 mg | EXTENDED_RELEASE_TABLET | Freq: Three times a day (TID) | ORAL | 0 refills | Status: DC
  Filled 2022-02-24: qty 90, 30d supply, fill #0

## 2022-02-24 MED ORDER — BACLOFEN 5 MG PO TABS
5.0000 mg | ORAL_TABLET | Freq: Every day | ORAL | 0 refills | Status: DC
  Filled 2022-02-24: qty 10, 10d supply, fill #0

## 2022-02-24 MED ORDER — GABAPENTIN 300 MG PO CAPS
900.0000 mg | ORAL_CAPSULE | Freq: Three times a day (TID) | ORAL | 6 refills | Status: AC
  Filled 2022-02-24: qty 270, 30d supply, fill #0

## 2022-02-24 MED ORDER — OXYCODONE HCL 10 MG PO TABS
10.0000 mg | ORAL_TABLET | Freq: Three times a day (TID) | ORAL | 0 refills | Status: DC | PRN
  Filled 2022-02-24: qty 60, 20d supply, fill #0

## 2022-02-25 ENCOUNTER — Other Ambulatory Visit (HOSPITAL_BASED_OUTPATIENT_CLINIC_OR_DEPARTMENT_OTHER): Payer: Self-pay

## 2022-03-16 ENCOUNTER — Other Ambulatory Visit (HOSPITAL_BASED_OUTPATIENT_CLINIC_OR_DEPARTMENT_OTHER): Payer: Self-pay

## 2022-03-16 MED ORDER — SAXENDA 18 MG/3ML ~~LOC~~ SOPN
3.0000 mg | PEN_INJECTOR | Freq: Every day | SUBCUTANEOUS | 3 refills | Status: AC
  Filled 2022-03-16 – 2022-03-20 (×2): qty 15, 30d supply, fill #0

## 2022-03-17 ENCOUNTER — Other Ambulatory Visit (HOSPITAL_BASED_OUTPATIENT_CLINIC_OR_DEPARTMENT_OTHER): Payer: Self-pay

## 2022-03-20 ENCOUNTER — Other Ambulatory Visit (HOSPITAL_BASED_OUTPATIENT_CLINIC_OR_DEPARTMENT_OTHER): Payer: Self-pay

## 2022-03-22 ENCOUNTER — Other Ambulatory Visit (HOSPITAL_BASED_OUTPATIENT_CLINIC_OR_DEPARTMENT_OTHER): Payer: Self-pay

## 2022-03-22 MED ORDER — PHENTERMINE HCL 15 MG PO CAPS
15.0000 mg | ORAL_CAPSULE | Freq: Every day | ORAL | 0 refills | Status: AC
  Filled 2022-03-22: qty 30, 30d supply, fill #0

## 2022-03-24 ENCOUNTER — Other Ambulatory Visit (HOSPITAL_BASED_OUTPATIENT_CLINIC_OR_DEPARTMENT_OTHER): Payer: Self-pay

## 2022-03-24 MED ORDER — BACLOFEN 5 MG PO TABS
5.0000 mg | ORAL_TABLET | Freq: Every day | ORAL | 0 refills | Status: AC
  Filled 2022-03-24: qty 15, 15d supply, fill #0

## 2022-03-25 ENCOUNTER — Other Ambulatory Visit (HOSPITAL_BASED_OUTPATIENT_CLINIC_OR_DEPARTMENT_OTHER): Payer: Self-pay

## 2022-03-26 ENCOUNTER — Other Ambulatory Visit (HOSPITAL_BASED_OUTPATIENT_CLINIC_OR_DEPARTMENT_OTHER): Payer: Self-pay

## 2022-03-26 MED ORDER — BACLOFEN 5 MG PO TABS
5.0000 mg | ORAL_TABLET | Freq: Every day | ORAL | 0 refills | Status: DC
  Filled 2022-03-26 – 2022-04-08 (×2): qty 15, 15d supply, fill #0

## 2022-03-26 MED ORDER — OXYCODONE HCL 10 MG PO TABS
10.0000 mg | ORAL_TABLET | Freq: Three times a day (TID) | ORAL | 0 refills | Status: DC | PRN
  Filled 2022-03-26: qty 90, 30d supply, fill #0

## 2022-03-26 MED ORDER — GABAPENTIN 300 MG PO CAPS
900.0000 mg | ORAL_CAPSULE | Freq: Three times a day (TID) | ORAL | 6 refills | Status: AC
  Filled 2022-03-26: qty 10, 1d supply, fill #0
  Filled 2022-03-26: qty 260, 29d supply, fill #0

## 2022-03-26 MED ORDER — MORPHINE SULFATE ER 15 MG PO TBCR
15.0000 mg | EXTENDED_RELEASE_TABLET | Freq: Three times a day (TID) | ORAL | 0 refills | Status: DC
  Filled 2022-03-26: qty 90, 30d supply, fill #0

## 2022-04-08 ENCOUNTER — Other Ambulatory Visit (HOSPITAL_BASED_OUTPATIENT_CLINIC_OR_DEPARTMENT_OTHER): Payer: Self-pay

## 2022-04-09 ENCOUNTER — Other Ambulatory Visit (HOSPITAL_BASED_OUTPATIENT_CLINIC_OR_DEPARTMENT_OTHER): Payer: Self-pay

## 2022-04-09 MED ORDER — DULOXETINE HCL 30 MG PO CPEP
30.0000 mg | ORAL_CAPSULE | Freq: Every day | ORAL | 1 refills | Status: AC
  Filled 2022-04-09: qty 90, 90d supply, fill #0

## 2022-04-09 MED ORDER — DULOXETINE HCL 60 MG PO CPEP
ORAL_CAPSULE | ORAL | 1 refills | Status: DC
  Filled 2022-04-09: qty 90, 90d supply, fill #0
  Filled 2022-07-26: qty 90, 90d supply, fill #1

## 2022-04-16 ENCOUNTER — Other Ambulatory Visit (HOSPITAL_BASED_OUTPATIENT_CLINIC_OR_DEPARTMENT_OTHER): Payer: Self-pay

## 2022-04-16 MED ORDER — LINZESS 145 MCG PO CAPS
145.0000 ug | ORAL_CAPSULE | Freq: Every day | ORAL | 3 refills | Status: AC
  Filled 2022-04-16: qty 30, 30d supply, fill #0

## 2022-04-16 MED ORDER — SYMPROIC 0.2 MG PO TABS
0.2000 mg | ORAL_TABLET | Freq: Every day | ORAL | 1 refills | Status: AC
  Filled 2022-04-16: qty 90, 90d supply, fill #0

## 2022-04-19 ENCOUNTER — Other Ambulatory Visit (HOSPITAL_BASED_OUTPATIENT_CLINIC_OR_DEPARTMENT_OTHER): Payer: Self-pay

## 2022-04-21 ENCOUNTER — Other Ambulatory Visit (HOSPITAL_BASED_OUTPATIENT_CLINIC_OR_DEPARTMENT_OTHER): Payer: Self-pay

## 2022-04-26 ENCOUNTER — Other Ambulatory Visit (HOSPITAL_BASED_OUTPATIENT_CLINIC_OR_DEPARTMENT_OTHER): Payer: Self-pay

## 2022-04-26 MED ORDER — BACLOFEN 5 MG PO TABS
5.0000 mg | ORAL_TABLET | Freq: Every day | ORAL | 0 refills | Status: DC
  Filled 2022-04-26: qty 15, 15d supply, fill #0

## 2022-04-26 MED ORDER — OXYCODONE HCL 10 MG PO TABS
10.0000 mg | ORAL_TABLET | Freq: Four times a day (QID) | ORAL | 0 refills | Status: DC | PRN
  Filled 2022-04-26: qty 120, 30d supply, fill #0

## 2022-04-26 MED ORDER — VALACYCLOVIR HCL 500 MG PO TABS
500.0000 mg | ORAL_TABLET | Freq: Two times a day (BID) | ORAL | 3 refills | Status: AC
  Filled 2022-04-26: qty 20, 10d supply, fill #0
  Filled 2022-05-16: qty 20, 10d supply, fill #1

## 2022-04-26 MED ORDER — GABAPENTIN 300 MG PO CAPS
900.0000 mg | ORAL_CAPSULE | Freq: Three times a day (TID) | ORAL | 6 refills | Status: AC
  Filled 2022-04-26: qty 59, 7d supply, fill #0
  Filled 2022-04-26: qty 211, 23d supply, fill #0

## 2022-04-26 MED ORDER — MORPHINE SULFATE ER 15 MG PO TBCR
15.0000 mg | EXTENDED_RELEASE_TABLET | Freq: Three times a day (TID) | ORAL | 0 refills | Status: DC
  Filled 2022-04-26: qty 90, 30d supply, fill #0

## 2022-04-26 MED ORDER — LINZESS 145 MCG PO CAPS
145.0000 ug | ORAL_CAPSULE | Freq: Every day | ORAL | 3 refills | Status: AC
  Filled 2022-04-26: qty 30, 30d supply, fill #0

## 2022-04-29 ENCOUNTER — Encounter (INDEPENDENT_AMBULATORY_CARE_PROVIDER_SITE_OTHER): Payer: Medicare PPO | Admitting: Ophthalmology

## 2022-05-25 ENCOUNTER — Other Ambulatory Visit (HOSPITAL_BASED_OUTPATIENT_CLINIC_OR_DEPARTMENT_OTHER): Payer: Self-pay

## 2022-05-25 MED ORDER — VALACYCLOVIR HCL 500 MG PO TABS
500.0000 mg | ORAL_TABLET | Freq: Two times a day (BID) | ORAL | 3 refills | Status: AC
  Filled 2022-05-25: qty 20, 10d supply, fill #0

## 2022-05-25 MED ORDER — OXYCODONE HCL 10 MG PO TABS
10.0000 mg | ORAL_TABLET | Freq: Three times a day (TID) | ORAL | 0 refills | Status: DC | PRN
  Filled 2022-05-25 – 2022-05-26 (×2): qty 90, 30d supply, fill #0

## 2022-05-25 MED ORDER — LINZESS 145 MCG PO CAPS
145.0000 ug | ORAL_CAPSULE | Freq: Every day | ORAL | 3 refills | Status: AC
  Filled 2022-05-25: qty 30, 30d supply, fill #0
  Filled 2022-10-13: qty 30, 30d supply, fill #1
  Filled 2022-12-24 – 2022-12-25 (×2): qty 30, 30d supply, fill #2
  Filled 2023-02-06: qty 30, 30d supply, fill #3

## 2022-05-25 MED ORDER — BACLOFEN 5 MG PO TABS
5.0000 mg | ORAL_TABLET | Freq: Every day | ORAL | 0 refills | Status: DC
  Filled 2022-05-25: qty 15, 15d supply, fill #0

## 2022-05-25 MED ORDER — GABAPENTIN 300 MG PO CAPS
900.0000 mg | ORAL_CAPSULE | Freq: Every day | ORAL | 6 refills | Status: AC
  Filled 2022-05-25: qty 270, 90d supply, fill #0

## 2022-05-25 MED ORDER — MORPHINE SULFATE ER 15 MG PO TBCR
15.0000 mg | EXTENDED_RELEASE_TABLET | Freq: Three times a day (TID) | ORAL | 0 refills | Status: DC
  Filled 2022-05-25 – 2022-05-26 (×2): qty 90, 30d supply, fill #0

## 2022-05-26 ENCOUNTER — Other Ambulatory Visit (HOSPITAL_BASED_OUTPATIENT_CLINIC_OR_DEPARTMENT_OTHER): Payer: Self-pay

## 2022-06-07 ENCOUNTER — Other Ambulatory Visit (HOSPITAL_BASED_OUTPATIENT_CLINIC_OR_DEPARTMENT_OTHER): Payer: Self-pay

## 2022-06-07 MED ORDER — GABAPENTIN 800 MG PO TABS
800.0000 mg | ORAL_TABLET | Freq: Three times a day (TID) | ORAL | 3 refills | Status: AC
  Filled 2022-06-07: qty 220, 73d supply, fill #0
  Filled 2022-06-07: qty 50, 17d supply, fill #0
  Filled 2022-09-07: qty 270, 90d supply, fill #1
  Filled 2023-03-21: qty 270, 90d supply, fill #2

## 2022-06-24 ENCOUNTER — Other Ambulatory Visit (HOSPITAL_BASED_OUTPATIENT_CLINIC_OR_DEPARTMENT_OTHER): Payer: Self-pay

## 2022-06-24 MED ORDER — BACLOFEN 5 MG PO TABS
5.0000 mg | ORAL_TABLET | Freq: Every day | ORAL | 0 refills | Status: DC
  Filled 2022-06-24: qty 15, 15d supply, fill #0

## 2022-06-24 MED ORDER — OXYCODONE HCL 10 MG PO TABS
10.0000 mg | ORAL_TABLET | Freq: Three times a day (TID) | ORAL | 0 refills | Status: DC | PRN
  Filled 2022-06-24: qty 90, 30d supply, fill #0

## 2022-06-24 MED ORDER — MORPHINE SULFATE ER 15 MG PO TBCR
15.0000 mg | EXTENDED_RELEASE_TABLET | Freq: Three times a day (TID) | ORAL | 0 refills | Status: DC
  Filled 2022-06-24: qty 90, 30d supply, fill #0

## 2022-07-26 ENCOUNTER — Other Ambulatory Visit (HOSPITAL_BASED_OUTPATIENT_CLINIC_OR_DEPARTMENT_OTHER): Payer: Self-pay

## 2022-07-26 ENCOUNTER — Other Ambulatory Visit: Payer: Self-pay

## 2022-07-26 MED ORDER — BACLOFEN 5 MG PO TABS
5.0000 mg | ORAL_TABLET | Freq: Every day | ORAL | 0 refills | Status: DC
  Filled 2022-07-26: qty 15, 15d supply, fill #0

## 2022-07-26 MED ORDER — MORPHINE SULFATE ER 15 MG PO TBCR
15.0000 mg | EXTENDED_RELEASE_TABLET | Freq: Three times a day (TID) | ORAL | 0 refills | Status: DC
  Filled 2022-07-26: qty 90, 30d supply, fill #0

## 2022-07-26 MED ORDER — OXYCODONE HCL 10 MG PO TABS
10.0000 mg | ORAL_TABLET | Freq: Three times a day (TID) | ORAL | 0 refills | Status: DC | PRN
  Filled 2022-07-26: qty 90, 30d supply, fill #0

## 2022-07-31 ENCOUNTER — Other Ambulatory Visit (HOSPITAL_BASED_OUTPATIENT_CLINIC_OR_DEPARTMENT_OTHER): Payer: Self-pay

## 2022-07-31 MED ORDER — VALACYCLOVIR HCL 500 MG PO TABS
500.0000 mg | ORAL_TABLET | Freq: Two times a day (BID) | ORAL | 3 refills | Status: AC
  Filled 2022-07-31: qty 20, 10d supply, fill #0
  Filled 2022-10-01: qty 20, 10d supply, fill #1

## 2022-07-31 MED ORDER — PHENTERMINE HCL 15 MG PO CAPS
15.0000 mg | ORAL_CAPSULE | Freq: Every day | ORAL | 0 refills | Status: AC
  Filled 2022-07-31: qty 30, 30d supply, fill #0

## 2022-07-31 MED ORDER — PROLIA 60 MG/ML ~~LOC~~ SOSY
PREFILLED_SYRINGE | SUBCUTANEOUS | 1 refills | Status: AC
  Filled 2022-07-31: qty 1, 180d supply, fill #0

## 2022-07-31 MED ORDER — SERTRALINE HCL 100 MG PO TABS
100.0000 mg | ORAL_TABLET | Freq: Every day | ORAL | 3 refills | Status: AC
  Filled 2022-07-31: qty 90, 90d supply, fill #0

## 2022-07-31 MED ORDER — LEVOTHYROXINE SODIUM 75 MCG PO TABS
70.0000 ug | ORAL_TABLET | Freq: Every morning | ORAL | 3 refills | Status: AC
  Filled 2022-07-31: qty 30, 30d supply, fill #0

## 2022-07-31 MED ORDER — ONDANSETRON HCL 4 MG PO TABS
4.0000 mg | ORAL_TABLET | Freq: Three times a day (TID) | ORAL | 3 refills | Status: AC | PRN
  Filled 2022-07-31 (×2): qty 90, 30d supply, fill #0

## 2022-07-31 MED ORDER — DULOXETINE HCL 30 MG PO CPEP
30.0000 mg | ORAL_CAPSULE | Freq: Every evening | ORAL | 1 refills | Status: AC
  Filled 2022-07-31: qty 90, 90d supply, fill #0

## 2022-07-31 MED ORDER — SAXENDA 18 MG/3ML ~~LOC~~ SOPN
3.0000 mg | PEN_INJECTOR | SUBCUTANEOUS | 3 refills | Status: AC
  Filled 2022-07-31: qty 15, 30d supply, fill #0
  Filled 2022-09-07: qty 15, 30d supply, fill #1

## 2022-07-31 MED ORDER — PRAVASTATIN SODIUM 40 MG PO TABS
40.0000 mg | ORAL_TABLET | Freq: Every day | ORAL | 1 refills | Status: DC
  Filled 2022-07-31: qty 90, 90d supply, fill #0

## 2022-07-31 MED ORDER — CHLORTHALIDONE 50 MG PO TABS
25.0000 mg | ORAL_TABLET | Freq: Every day | ORAL | 3 refills | Status: AC | PRN
  Filled 2022-07-31 – 2023-03-28 (×2): qty 30, 30d supply, fill #0
  Filled 2023-04-21: qty 30, 30d supply, fill #1
  Filled 2023-05-16: qty 30, 30d supply, fill #2

## 2022-08-02 ENCOUNTER — Other Ambulatory Visit (HOSPITAL_BASED_OUTPATIENT_CLINIC_OR_DEPARTMENT_OTHER): Payer: Self-pay

## 2022-08-02 ENCOUNTER — Other Ambulatory Visit: Payer: Self-pay

## 2022-08-24 ENCOUNTER — Other Ambulatory Visit (HOSPITAL_BASED_OUTPATIENT_CLINIC_OR_DEPARTMENT_OTHER): Payer: Self-pay

## 2022-08-24 MED ORDER — LINZESS 145 MCG PO CAPS
145.0000 ug | ORAL_CAPSULE | Freq: Every day | ORAL | 3 refills | Status: DC
  Filled 2022-08-24: qty 30, 30d supply, fill #0
  Filled 2023-05-30: qty 30, 30d supply, fill #1

## 2022-08-24 MED ORDER — OXYCODONE HCL 10 MG PO TABS
10.0000 mg | ORAL_TABLET | Freq: Three times a day (TID) | ORAL | 0 refills | Status: AC | PRN
  Filled 2022-08-24: qty 90, 30d supply, fill #0

## 2022-08-24 MED ORDER — BACLOFEN 5 MG PO TABS
5.0000 mg | ORAL_TABLET | Freq: Every day | ORAL | 0 refills | Status: AC
  Filled 2022-08-24: qty 15, 15d supply, fill #0

## 2022-08-24 MED ORDER — MORPHINE SULFATE ER 15 MG PO TBCR
15.0000 mg | EXTENDED_RELEASE_TABLET | Freq: Three times a day (TID) | ORAL | 0 refills | Status: DC
  Filled 2022-08-24: qty 90, 30d supply, fill #0

## 2022-08-28 ENCOUNTER — Other Ambulatory Visit (HOSPITAL_BASED_OUTPATIENT_CLINIC_OR_DEPARTMENT_OTHER): Payer: Self-pay

## 2022-08-28 MED ORDER — SAXENDA 18 MG/3ML ~~LOC~~ SOPN
PEN_INJECTOR | SUBCUTANEOUS | 0 refills | Status: AC
  Filled 2022-08-28: qty 15, 28d supply, fill #0

## 2022-08-28 MED ORDER — PHENTERMINE HCL 30 MG PO CAPS
30.0000 mg | ORAL_CAPSULE | Freq: Every day | ORAL | 0 refills | Status: AC
  Filled 2022-08-28: qty 30, 30d supply, fill #0

## 2022-09-06 ENCOUNTER — Other Ambulatory Visit (HOSPITAL_BASED_OUTPATIENT_CLINIC_OR_DEPARTMENT_OTHER): Payer: Self-pay

## 2022-09-07 ENCOUNTER — Other Ambulatory Visit (HOSPITAL_BASED_OUTPATIENT_CLINIC_OR_DEPARTMENT_OTHER): Payer: Self-pay

## 2022-09-21 ENCOUNTER — Other Ambulatory Visit (HOSPITAL_BASED_OUTPATIENT_CLINIC_OR_DEPARTMENT_OTHER): Payer: Self-pay

## 2022-09-21 MED ORDER — OXYCODONE HCL 10 MG PO TABS
10.0000 mg | ORAL_TABLET | Freq: Three times a day (TID) | ORAL | 0 refills | Status: AC | PRN
  Filled 2022-09-21: qty 90, 30d supply, fill #0

## 2022-09-21 MED ORDER — MORPHINE SULFATE ER 15 MG PO TBCR
15.0000 mg | EXTENDED_RELEASE_TABLET | Freq: Three times a day (TID) | ORAL | 0 refills | Status: DC
  Filled 2022-09-21: qty 90, 30d supply, fill #0

## 2022-10-01 ENCOUNTER — Other Ambulatory Visit (HOSPITAL_BASED_OUTPATIENT_CLINIC_OR_DEPARTMENT_OTHER): Payer: Self-pay

## 2022-10-18 ENCOUNTER — Other Ambulatory Visit (HOSPITAL_BASED_OUTPATIENT_CLINIC_OR_DEPARTMENT_OTHER): Payer: Self-pay

## 2022-10-18 MED ORDER — OXYCODONE HCL 10 MG PO TABS
10.0000 mg | ORAL_TABLET | Freq: Three times a day (TID) | ORAL | 0 refills | Status: AC | PRN
  Filled 2022-10-18: qty 90, 30d supply, fill #0

## 2022-10-18 MED ORDER — MORPHINE SULFATE ER 15 MG PO TBCR
15.0000 mg | EXTENDED_RELEASE_TABLET | Freq: Three times a day (TID) | ORAL | 0 refills | Status: DC
  Filled 2022-10-18: qty 90, 30d supply, fill #0

## 2022-10-22 ENCOUNTER — Other Ambulatory Visit: Payer: Self-pay

## 2022-10-22 ENCOUNTER — Other Ambulatory Visit (HOSPITAL_BASED_OUTPATIENT_CLINIC_OR_DEPARTMENT_OTHER): Payer: Self-pay

## 2022-10-22 MED ORDER — PHENTERMINE HCL 37.5 MG PO TABS
37.5000 mg | ORAL_TABLET | Freq: Every day | ORAL | 0 refills | Status: DC
  Filled 2022-10-22: qty 30, 30d supply, fill #0

## 2022-10-22 MED ORDER — CEPHALEXIN 500 MG PO CAPS
500.0000 mg | ORAL_CAPSULE | Freq: Two times a day (BID) | ORAL | 0 refills | Status: AC
  Filled 2022-10-22: qty 14, 7d supply, fill #0

## 2022-10-22 MED ORDER — NYSTATIN 100000 UNIT/GM EX OINT
TOPICAL_OINTMENT | Freq: Two times a day (BID) | CUTANEOUS | 3 refills | Status: AC
  Filled 2022-10-22: qty 30, 15d supply, fill #0
  Filled 2023-03-21: qty 30, 15d supply, fill #1

## 2022-10-22 MED ORDER — FLUCONAZOLE 200 MG PO TABS
200.0000 mg | ORAL_TABLET | Freq: Every day | ORAL | 0 refills | Status: AC
  Filled 2022-10-22: qty 5, 5d supply, fill #0

## 2022-10-22 MED ORDER — SAXENDA 18 MG/3ML ~~LOC~~ SOPN
3.0000 mg | PEN_INJECTOR | Freq: Every day | SUBCUTANEOUS | 3 refills | Status: AC
  Filled 2022-10-22: qty 15, 30d supply, fill #0

## 2022-10-22 MED ORDER — DULOXETINE HCL 60 MG PO CPEP
60.0000 mg | ORAL_CAPSULE | Freq: Every morning | ORAL | 1 refills | Status: AC
  Filled 2022-10-22: qty 20, 20d supply, fill #0
  Filled 2022-10-22: qty 70, 70d supply, fill #0
  Filled 2023-05-16: qty 90, 90d supply, fill #1

## 2022-11-16 ENCOUNTER — Other Ambulatory Visit (HOSPITAL_BASED_OUTPATIENT_CLINIC_OR_DEPARTMENT_OTHER): Payer: Self-pay

## 2022-11-19 ENCOUNTER — Other Ambulatory Visit (HOSPITAL_BASED_OUTPATIENT_CLINIC_OR_DEPARTMENT_OTHER): Payer: Self-pay

## 2022-11-19 MED ORDER — QUETIAPINE FUMARATE 25 MG PO TABS
25.0000 mg | ORAL_TABLET | Freq: Every morning | ORAL | 0 refills | Status: DC
  Filled 2022-11-19: qty 30, 30d supply, fill #0

## 2022-11-19 MED ORDER — MORPHINE SULFATE ER 15 MG PO TBCR
15.0000 mg | EXTENDED_RELEASE_TABLET | Freq: Three times a day (TID) | ORAL | 0 refills | Status: DC
  Filled 2022-11-19: qty 90, 30d supply, fill #0

## 2022-11-19 MED ORDER — NALOXONE HCL 4 MG/0.1ML NA LIQD
1.0000 | NASAL | 0 refills | Status: AC | PRN
Start: 2022-11-19 — End: ?
  Filled 2022-11-19: qty 2, 30d supply, fill #0

## 2022-11-19 MED ORDER — OXYCODONE HCL 10 MG PO TABS
10.0000 mg | ORAL_TABLET | Freq: Three times a day (TID) | ORAL | 0 refills | Status: AC | PRN
  Filled 2022-11-19: qty 90, 30d supply, fill #0

## 2022-11-19 MED ORDER — QUETIAPINE FUMARATE 50 MG PO TABS
50.0000 mg | ORAL_TABLET | Freq: Every day | ORAL | 0 refills | Status: DC
  Filled 2022-11-19: qty 30, 30d supply, fill #0

## 2022-12-06 ENCOUNTER — Other Ambulatory Visit (HOSPITAL_BASED_OUTPATIENT_CLINIC_OR_DEPARTMENT_OTHER): Payer: Self-pay

## 2022-12-06 MED ORDER — GABAPENTIN 800 MG PO TABS
800.0000 mg | ORAL_TABLET | Freq: Three times a day (TID) | ORAL | 3 refills | Status: AC
  Filled 2022-12-06: qty 270, 90d supply, fill #0
  Filled 2023-06-29: qty 270, 90d supply, fill #1

## 2022-12-09 ENCOUNTER — Other Ambulatory Visit (HOSPITAL_BASED_OUTPATIENT_CLINIC_OR_DEPARTMENT_OTHER): Payer: Self-pay

## 2022-12-09 MED ORDER — SAXENDA 18 MG/3ML ~~LOC~~ SOPN
3.0000 mg | PEN_INJECTOR | Freq: Every day | SUBCUTANEOUS | 3 refills | Status: AC
  Filled 2022-12-09: qty 15, 30d supply, fill #0

## 2022-12-09 MED ORDER — VALACYCLOVIR HCL 500 MG PO TABS
500.0000 mg | ORAL_TABLET | Freq: Two times a day (BID) | ORAL | 3 refills | Status: AC
  Filled 2022-12-09: qty 20, 10d supply, fill #0

## 2022-12-09 MED ORDER — PROLIA 60 MG/ML ~~LOC~~ SOSY
60.0000 mg | PREFILLED_SYRINGE | SUBCUTANEOUS | 1 refills | Status: AC
Start: 2022-12-09 — End: ?
  Filled 2022-12-09: qty 1, 180d supply, fill #0

## 2022-12-09 MED ORDER — ONDANSETRON HCL 4 MG PO TABS
4.0000 mg | ORAL_TABLET | Freq: Three times a day (TID) | ORAL | 3 refills | Status: AC | PRN
  Filled 2022-12-09: qty 90, 30d supply, fill #0

## 2022-12-09 MED ORDER — PHENTERMINE HCL 37.5 MG PO TABS
37.5000 mg | ORAL_TABLET | Freq: Every day | ORAL | 0 refills | Status: DC
  Filled 2022-12-09: qty 30, 30d supply, fill #0

## 2022-12-13 ENCOUNTER — Other Ambulatory Visit (HOSPITAL_BASED_OUTPATIENT_CLINIC_OR_DEPARTMENT_OTHER): Payer: Self-pay

## 2022-12-13 MED ORDER — QUETIAPINE FUMARATE 50 MG PO TABS
50.0000 mg | ORAL_TABLET | Freq: Every day | ORAL | 0 refills | Status: DC
  Filled 2022-12-13: qty 30, 30d supply, fill #0

## 2022-12-13 MED ORDER — QUETIAPINE FUMARATE 25 MG PO TABS
25.0000 mg | ORAL_TABLET | Freq: Every morning | ORAL | 0 refills | Status: DC
  Filled 2022-12-13: qty 30, 30d supply, fill #0

## 2022-12-13 MED ORDER — MELATONIN 5 MG PO TABS
5.0000 mg | ORAL_TABLET | Freq: Every day | ORAL | 1 refills | Status: AC
  Filled 2022-12-13: qty 50, 25d supply, fill #0

## 2022-12-14 ENCOUNTER — Other Ambulatory Visit (HOSPITAL_BASED_OUTPATIENT_CLINIC_OR_DEPARTMENT_OTHER): Payer: Self-pay

## 2022-12-15 ENCOUNTER — Other Ambulatory Visit (HOSPITAL_BASED_OUTPATIENT_CLINIC_OR_DEPARTMENT_OTHER): Payer: Self-pay

## 2022-12-16 ENCOUNTER — Other Ambulatory Visit (HOSPITAL_BASED_OUTPATIENT_CLINIC_OR_DEPARTMENT_OTHER): Payer: Self-pay

## 2022-12-16 MED ORDER — TIZANIDINE HCL 4 MG PO TABS
4.0000 mg | ORAL_TABLET | Freq: Three times a day (TID) | ORAL | 0 refills | Status: AC
Start: 2022-12-16 — End: ?
  Filled 2022-12-16 – 2022-12-18 (×2): qty 15, 5d supply, fill #0

## 2022-12-18 ENCOUNTER — Other Ambulatory Visit (HOSPITAL_BASED_OUTPATIENT_CLINIC_OR_DEPARTMENT_OTHER): Payer: Self-pay

## 2022-12-20 ENCOUNTER — Other Ambulatory Visit (HOSPITAL_BASED_OUTPATIENT_CLINIC_OR_DEPARTMENT_OTHER): Payer: Self-pay

## 2022-12-20 MED ORDER — PHENTERMINE HCL 37.5 MG PO TABS
37.5000 mg | ORAL_TABLET | Freq: Every day | ORAL | 0 refills | Status: AC
  Filled 2022-12-20: qty 30, 30d supply, fill #0

## 2022-12-20 MED ORDER — SAXENDA 18 MG/3ML ~~LOC~~ SOPN
3.0000 mg | PEN_INJECTOR | Freq: Every day | SUBCUTANEOUS | 3 refills | Status: AC
  Filled 2022-12-20: qty 15, 30d supply, fill #0

## 2022-12-25 ENCOUNTER — Other Ambulatory Visit (HOSPITAL_BASED_OUTPATIENT_CLINIC_OR_DEPARTMENT_OTHER): Payer: Self-pay

## 2023-01-12 ENCOUNTER — Other Ambulatory Visit (HOSPITAL_BASED_OUTPATIENT_CLINIC_OR_DEPARTMENT_OTHER): Payer: Self-pay

## 2023-01-12 MED ORDER — QUETIAPINE FUMARATE 50 MG PO TABS
50.0000 mg | ORAL_TABLET | Freq: Every evening | ORAL | 0 refills | Status: AC
  Filled 2023-01-12: qty 30, 30d supply, fill #0

## 2023-01-12 MED ORDER — MELATONIN 5 MG PO TABS
5.0000 mg | ORAL_TABLET | Freq: Every day | ORAL | 1 refills | Status: AC
  Filled 2023-01-12: qty 50, 25d supply, fill #0

## 2023-01-12 MED ORDER — SERTRALINE HCL 100 MG PO TABS
100.0000 mg | ORAL_TABLET | Freq: Every day | ORAL | 3 refills | Status: AC
  Filled 2023-01-12: qty 90, 90d supply, fill #0

## 2023-01-12 MED ORDER — SERTRALINE HCL 100 MG PO TABS
100.0000 mg | ORAL_TABLET | Freq: Every day | ORAL | 2 refills | Status: AC
  Filled 2023-01-12: qty 90, 90d supply, fill #0

## 2023-01-12 MED ORDER — QUETIAPINE FUMARATE 25 MG PO TABS
25.0000 mg | ORAL_TABLET | Freq: Every morning | ORAL | 2 refills | Status: AC
  Filled 2023-01-12 – 2023-02-17 (×2): qty 30, 30d supply, fill #0
  Filled 2023-03-20: qty 30, 30d supply, fill #1

## 2023-01-12 MED ORDER — MELATONIN 5 MG PO TABS
5.0000 mg | ORAL_TABLET | Freq: Every day | ORAL | 2 refills | Status: AC
  Filled 2023-01-12: qty 60, 30d supply, fill #0

## 2023-01-12 MED ORDER — QUETIAPINE FUMARATE 50 MG PO TABS
50.0000 mg | ORAL_TABLET | Freq: Every day | ORAL | 2 refills | Status: AC
  Filled 2023-01-12 – 2023-02-17 (×2): qty 30, 30d supply, fill #0
  Filled 2023-03-20: qty 30, 30d supply, fill #1

## 2023-01-12 MED ORDER — QUETIAPINE FUMARATE 25 MG PO TABS
25.0000 mg | ORAL_TABLET | Freq: Every morning | ORAL | 0 refills | Status: AC
  Filled 2023-01-12: qty 30, 30d supply, fill #0

## 2023-01-13 ENCOUNTER — Other Ambulatory Visit (HOSPITAL_BASED_OUTPATIENT_CLINIC_OR_DEPARTMENT_OTHER): Payer: Self-pay

## 2023-01-17 ENCOUNTER — Other Ambulatory Visit (HOSPITAL_BASED_OUTPATIENT_CLINIC_OR_DEPARTMENT_OTHER): Payer: Self-pay

## 2023-01-17 MED ORDER — BACLOFEN 5 MG PO TABS
5.0000 mg | ORAL_TABLET | Freq: Two times a day (BID) | ORAL | 0 refills | Status: DC | PRN
  Filled 2023-01-17: qty 60, 30d supply, fill #0

## 2023-01-17 MED ORDER — PHENTERMINE HCL 37.5 MG PO TABS
37.5000 mg | ORAL_TABLET | Freq: Every day | ORAL | 0 refills | Status: DC
  Filled 2023-01-17: qty 30, 30d supply, fill #0

## 2023-01-17 MED ORDER — SAXENDA 18 MG/3ML ~~LOC~~ SOPN
3.0000 mg | PEN_INJECTOR | Freq: Every day | SUBCUTANEOUS | 3 refills | Status: AC
  Filled 2023-01-17: qty 15, 30d supply, fill #0

## 2023-01-17 MED ORDER — MORPHINE SULFATE ER 15 MG PO TBCR
15.0000 mg | EXTENDED_RELEASE_TABLET | Freq: Three times a day (TID) | ORAL | 0 refills | Status: DC
  Filled 2023-01-17: qty 90, 30d supply, fill #0

## 2023-01-17 MED ORDER — OXYCODONE HCL 10 MG PO TABS
10.0000 mg | ORAL_TABLET | Freq: Three times a day (TID) | ORAL | 0 refills | Status: DC | PRN
  Filled 2023-01-17: qty 90, 30d supply, fill #0

## 2023-01-19 ENCOUNTER — Other Ambulatory Visit (HOSPITAL_BASED_OUTPATIENT_CLINIC_OR_DEPARTMENT_OTHER): Payer: Self-pay

## 2023-02-07 ENCOUNTER — Other Ambulatory Visit: Payer: Self-pay

## 2023-02-09 ENCOUNTER — Other Ambulatory Visit (HOSPITAL_BASED_OUTPATIENT_CLINIC_OR_DEPARTMENT_OTHER): Payer: Self-pay

## 2023-02-09 ENCOUNTER — Other Ambulatory Visit (HOSPITAL_COMMUNITY): Payer: Self-pay | Admitting: Pharmacy Technician

## 2023-02-09 ENCOUNTER — Other Ambulatory Visit (HOSPITAL_COMMUNITY): Payer: Self-pay

## 2023-02-09 ENCOUNTER — Other Ambulatory Visit: Payer: Self-pay

## 2023-02-09 ENCOUNTER — Encounter (HOSPITAL_COMMUNITY): Payer: Self-pay

## 2023-02-09 MED ORDER — LINZESS 145 MCG PO CAPS
145.0000 ug | ORAL_CAPSULE | Freq: Every day | ORAL | 3 refills | Status: AC
  Filled 2023-02-09: qty 30, 30d supply, fill #0

## 2023-02-09 MED ORDER — ONDANSETRON HCL 4 MG PO TABS
4.0000 mg | ORAL_TABLET | Freq: Three times a day (TID) | ORAL | 3 refills | Status: AC | PRN
  Filled 2023-02-09: qty 90, 30d supply, fill #0
  Filled 2023-03-20: qty 90, 30d supply, fill #1
  Filled 2023-05-12: qty 90, 30d supply, fill #2

## 2023-02-09 MED ORDER — VALACYCLOVIR HCL 500 MG PO TABS
500.0000 mg | ORAL_TABLET | Freq: Two times a day (BID) | ORAL | 3 refills | Status: AC
  Filled 2023-02-09: qty 20, 10d supply, fill #0
  Filled 2023-03-20: qty 20, 10d supply, fill #1
  Filled 2023-04-18: qty 20, 10d supply, fill #2
  Filled 2023-05-16: qty 20, 10d supply, fill #3

## 2023-02-09 MED ORDER — PHENTERMINE HCL 37.5 MG PO TABS: 37.5000 mg | ORAL_TABLET | Freq: Every day | ORAL | 0 refills | Status: AC

## 2023-02-09 MED ORDER — PROLIA 60 MG/ML ~~LOC~~ SOSY
60.0000 mg | PREFILLED_SYRINGE | SUBCUTANEOUS | 1 refills | Status: AC
  Filled 2023-02-09 – 2023-03-02 (×2): qty 1, 180d supply, fill #0
  Filled 2023-08-24: qty 1, 180d supply, fill #1

## 2023-02-09 MED ORDER — PRAVASTATIN SODIUM 40 MG PO TABS
40.0000 mg | ORAL_TABLET | Freq: Every day | ORAL | 1 refills | Status: DC
  Filled 2023-02-09: qty 90, 90d supply, fill #0

## 2023-02-09 MED ORDER — MORPHINE SULFATE ER 15 MG PO TBCR
15.0000 mg | EXTENDED_RELEASE_TABLET | Freq: Three times a day (TID) | ORAL | 0 refills | Status: DC
  Filled 2023-02-09 – 2023-02-14 (×3): qty 90, 30d supply, fill #0

## 2023-02-09 MED ORDER — OXYCODONE HCL 10 MG PO TABS
10.0000 mg | ORAL_TABLET | Freq: Three times a day (TID) | ORAL | 0 refills | Status: DC | PRN
  Filled 2023-02-09 – 2023-02-14 (×2): qty 90, 30d supply, fill #0

## 2023-02-09 MED ORDER — PANTOPRAZOLE SODIUM 40 MG PO TBEC
40.0000 mg | DELAYED_RELEASE_TABLET | Freq: Two times a day (BID) | ORAL | 0 refills | Status: AC
  Filled 2023-02-09: qty 180, 90d supply, fill #0

## 2023-02-09 MED ORDER — SYMPROIC 0.2 MG PO TABS
0.2000 mg | ORAL_TABLET | Freq: Every day | ORAL | 1 refills | Status: AC
  Filled 2023-02-09: qty 90, 90d supply, fill #0

## 2023-02-09 MED ORDER — SAXENDA 18 MG/3ML ~~LOC~~ SOPN
3.0000 mg | PEN_INJECTOR | Freq: Every day | SUBCUTANEOUS | 3 refills | Status: AC
  Filled 2023-02-09: qty 15, 30d supply, fill #0

## 2023-02-10 ENCOUNTER — Other Ambulatory Visit (HOSPITAL_BASED_OUTPATIENT_CLINIC_OR_DEPARTMENT_OTHER): Payer: Self-pay

## 2023-02-11 ENCOUNTER — Other Ambulatory Visit: Payer: Self-pay

## 2023-02-14 ENCOUNTER — Other Ambulatory Visit (HOSPITAL_BASED_OUTPATIENT_CLINIC_OR_DEPARTMENT_OTHER): Payer: Self-pay

## 2023-02-14 ENCOUNTER — Other Ambulatory Visit: Payer: Self-pay

## 2023-02-15 ENCOUNTER — Other Ambulatory Visit (HOSPITAL_BASED_OUTPATIENT_CLINIC_OR_DEPARTMENT_OTHER): Payer: Self-pay

## 2023-02-17 ENCOUNTER — Other Ambulatory Visit (HOSPITAL_BASED_OUTPATIENT_CLINIC_OR_DEPARTMENT_OTHER): Payer: Self-pay

## 2023-02-17 ENCOUNTER — Other Ambulatory Visit: Payer: Self-pay

## 2023-02-17 ENCOUNTER — Other Ambulatory Visit (HOSPITAL_COMMUNITY): Payer: Self-pay

## 2023-02-18 ENCOUNTER — Other Ambulatory Visit (HOSPITAL_BASED_OUTPATIENT_CLINIC_OR_DEPARTMENT_OTHER): Payer: Self-pay

## 2023-02-18 ENCOUNTER — Other Ambulatory Visit: Payer: Self-pay

## 2023-02-21 ENCOUNTER — Other Ambulatory Visit (HOSPITAL_BASED_OUTPATIENT_CLINIC_OR_DEPARTMENT_OTHER): Payer: Self-pay

## 2023-02-21 ENCOUNTER — Other Ambulatory Visit (HOSPITAL_COMMUNITY): Payer: Self-pay

## 2023-02-28 ENCOUNTER — Other Ambulatory Visit: Payer: Self-pay

## 2023-03-02 ENCOUNTER — Other Ambulatory Visit: Payer: Self-pay

## 2023-03-03 ENCOUNTER — Other Ambulatory Visit (HOSPITAL_BASED_OUTPATIENT_CLINIC_OR_DEPARTMENT_OTHER): Payer: Self-pay

## 2023-03-03 MED ORDER — FAMOTIDINE 40 MG PO TABS
40.0000 mg | ORAL_TABLET | Freq: Every day | ORAL | 0 refills | Status: AC
  Filled 2023-03-03: qty 90, 90d supply, fill #0

## 2023-03-04 ENCOUNTER — Other Ambulatory Visit: Payer: Self-pay

## 2023-03-07 ENCOUNTER — Other Ambulatory Visit: Payer: Self-pay

## 2023-03-08 ENCOUNTER — Other Ambulatory Visit: Payer: Self-pay

## 2023-03-14 ENCOUNTER — Other Ambulatory Visit (HOSPITAL_BASED_OUTPATIENT_CLINIC_OR_DEPARTMENT_OTHER): Payer: Self-pay

## 2023-03-17 ENCOUNTER — Other Ambulatory Visit (HOSPITAL_BASED_OUTPATIENT_CLINIC_OR_DEPARTMENT_OTHER): Payer: Self-pay

## 2023-03-17 MED ORDER — QUETIAPINE FUMARATE 25 MG PO TABS
25.0000 mg | ORAL_TABLET | Freq: Every morning | ORAL | 2 refills | Status: AC
  Filled 2023-03-17: qty 30, 30d supply, fill #0
  Filled 2023-04-18: qty 30, 30d supply, fill #1
  Filled 2023-05-16: qty 30, 30d supply, fill #2

## 2023-03-17 MED ORDER — QUETIAPINE FUMARATE 50 MG PO TABS
50.0000 mg | ORAL_TABLET | Freq: Every day | ORAL | 2 refills | Status: AC
  Filled 2023-03-17: qty 30, 30d supply, fill #0
  Filled 2023-04-18: qty 30, 30d supply, fill #1
  Filled 2023-05-16: qty 30, 30d supply, fill #2

## 2023-03-17 MED ORDER — MELATONIN 5 MG PO TABS
5.0000 mg | ORAL_TABLET | Freq: Every day | ORAL | 2 refills | Status: AC
  Filled 2023-03-17: qty 60, 30d supply, fill #0
  Filled 2023-05-16: qty 60, 30d supply, fill #1

## 2023-03-17 MED ORDER — SERTRALINE HCL 100 MG PO TABS
100.0000 mg | ORAL_TABLET | Freq: Every day | ORAL | 2 refills | Status: AC
  Filled 2023-03-17: qty 90, 90d supply, fill #0
  Filled 2023-06-15: qty 90, 90d supply, fill #1

## 2023-03-21 ENCOUNTER — Other Ambulatory Visit: Payer: Self-pay

## 2023-03-21 ENCOUNTER — Other Ambulatory Visit (HOSPITAL_BASED_OUTPATIENT_CLINIC_OR_DEPARTMENT_OTHER): Payer: Self-pay

## 2023-03-21 MED ORDER — SAXENDA 18 MG/3ML ~~LOC~~ SOPN
3.0000 mg | PEN_INJECTOR | Freq: Every day | SUBCUTANEOUS | 3 refills | Status: AC
  Filled 2023-03-21: qty 15, 30d supply, fill #0

## 2023-03-21 MED ORDER — BACLOFEN 5 MG PO TABS
5.0000 mg | ORAL_TABLET | Freq: Two times a day (BID) | ORAL | 0 refills | Status: DC | PRN
  Filled 2023-03-21: qty 60, 30d supply, fill #0

## 2023-03-21 MED ORDER — PHENTERMINE HCL 37.5 MG PO TABS
37.5000 mg | ORAL_TABLET | Freq: Every day | ORAL | 0 refills | Status: AC
  Filled 2023-03-21: qty 30, 30d supply, fill #0

## 2023-03-21 MED ORDER — OXYCODONE HCL 10 MG PO TABS
10.0000 mg | ORAL_TABLET | Freq: Three times a day (TID) | ORAL | 0 refills | Status: DC | PRN
  Filled 2023-03-21: qty 90, 30d supply, fill #0

## 2023-03-21 MED ORDER — VALACYCLOVIR HCL 500 MG PO TABS
500.0000 mg | ORAL_TABLET | Freq: Two times a day (BID) | ORAL | 3 refills | Status: AC
  Filled 2023-03-21: qty 20, 10d supply, fill #0

## 2023-03-21 MED ORDER — MORPHINE SULFATE ER 15 MG PO TBCR
15.0000 mg | EXTENDED_RELEASE_TABLET | Freq: Three times a day (TID) | ORAL | 0 refills | Status: DC
  Filled 2023-03-21: qty 90, 30d supply, fill #0

## 2023-03-22 ENCOUNTER — Other Ambulatory Visit (HOSPITAL_BASED_OUTPATIENT_CLINIC_OR_DEPARTMENT_OTHER): Payer: Self-pay

## 2023-03-28 ENCOUNTER — Other Ambulatory Visit (HOSPITAL_BASED_OUTPATIENT_CLINIC_OR_DEPARTMENT_OTHER): Payer: Self-pay

## 2023-03-31 ENCOUNTER — Other Ambulatory Visit (HOSPITAL_BASED_OUTPATIENT_CLINIC_OR_DEPARTMENT_OTHER): Payer: Self-pay

## 2023-04-11 ENCOUNTER — Other Ambulatory Visit (HOSPITAL_BASED_OUTPATIENT_CLINIC_OR_DEPARTMENT_OTHER): Payer: Self-pay

## 2023-04-18 ENCOUNTER — Other Ambulatory Visit: Payer: Self-pay

## 2023-04-18 ENCOUNTER — Encounter (HOSPITAL_COMMUNITY): Payer: Self-pay | Admitting: Emergency Medicine

## 2023-04-18 ENCOUNTER — Emergency Department (HOSPITAL_COMMUNITY): Payer: Medicare PPO

## 2023-04-18 ENCOUNTER — Other Ambulatory Visit (HOSPITAL_BASED_OUTPATIENT_CLINIC_OR_DEPARTMENT_OTHER): Payer: Self-pay

## 2023-04-18 ENCOUNTER — Emergency Department (HOSPITAL_COMMUNITY)
Admission: EM | Admit: 2023-04-18 | Discharge: 2023-04-19 | Discharge status: Against Medical Advice | Payer: Medicare PPO | Attending: Emergency Medicine | Admitting: Emergency Medicine

## 2023-04-18 DIAGNOSIS — Y92014 Private driveway to single-family (private) house as the place of occurrence of the external cause: Secondary | ICD-10-CM | POA: Insufficient documentation

## 2023-04-18 DIAGNOSIS — W01198A Fall on same level from slipping, tripping and stumbling with subsequent striking against other object, initial encounter: Secondary | ICD-10-CM | POA: Insufficient documentation

## 2023-04-18 DIAGNOSIS — M79644 Pain in right finger(s): Secondary | ICD-10-CM | POA: Insufficient documentation

## 2023-04-18 DIAGNOSIS — S0081XA Abrasion of other part of head, initial encounter: Secondary | ICD-10-CM | POA: Insufficient documentation

## 2023-04-18 DIAGNOSIS — Z5321 Procedure and treatment not carried out due to patient leaving prior to being seen by health care provider: Secondary | ICD-10-CM | POA: Insufficient documentation

## 2023-04-18 DIAGNOSIS — Y9301 Activity, walking, marching and hiking: Secondary | ICD-10-CM | POA: Insufficient documentation

## 2023-04-18 DIAGNOSIS — R519 Headache, unspecified: Secondary | ICD-10-CM | POA: Diagnosis present

## 2023-04-18 MED ORDER — CHLORTHALIDONE 50 MG PO TABS
25.0000 mg | ORAL_TABLET | ORAL | 2 refills | Status: AC | PRN
  Filled 2023-04-18 – 2023-04-20 (×2): qty 30, 30d supply, fill #0

## 2023-04-18 MED ORDER — MORPHINE SULFATE ER 15 MG PO TBCR
15.0000 mg | EXTENDED_RELEASE_TABLET | Freq: Three times a day (TID) | ORAL | 0 refills | Status: DC
  Filled 2023-04-18: qty 90, 30d supply, fill #0

## 2023-04-18 MED ORDER — PHENTERMINE HCL 37.5 MG PO TABS
37.5000 mg | ORAL_TABLET | Freq: Every day | ORAL | 1 refills | Status: AC
  Filled 2023-04-18: qty 30, 30d supply, fill #0
  Filled 2023-05-16: qty 30, 30d supply, fill #1

## 2023-04-18 MED ORDER — OXYCODONE HCL 10 MG PO TABS
10.0000 mg | ORAL_TABLET | Freq: Three times a day (TID) | ORAL | 0 refills | Status: DC | PRN
Start: 2023-04-18 — End: 2023-05-16
  Filled 2023-04-18: qty 90, 30d supply, fill #0

## 2023-04-18 MED ORDER — SAXENDA 18 MG/3ML ~~LOC~~ SOPN
3.0000 mg | PEN_INJECTOR | Freq: Every day | SUBCUTANEOUS | 3 refills | Status: AC
Start: 2023-04-18 — End: ?
  Filled 2023-04-18: qty 15, 30d supply, fill #0

## 2023-04-18 NOTE — ED Triage Notes (Addendum)
 Pt in with frontal head pain and hematoma after trip fall on the gravel driveway an hr ago. No thinners or LOC, small abrasion present to lips, teeth intact. Denies confusion or blurred vision, denies any neck pain, +R thumb pain. Took morphine PTA

## 2023-04-18 NOTE — ED Provider Triage Note (Signed)
 Emergency Medicine Provider Triage Evaluation Note  Morgan Juarez , a 68 y.o. female  was evaluated in triage.  Pt complains of fall.  Patient reports that she has Guillain-Barr syndrome and because of this she was walking from her driveway to her house when she fell over a lip of a curb.  Denies preceding chest pain or shortness of breath.  States that she struck her face on concrete.  Here complaining of headache, facial pain.  Denies blood thinners, nausea, vomiting, loss of consciousness.  Review of Systems  Positive:  Negative:   Physical Exam  BP 122/71 (BP Location: Left Arm)   Pulse 64   Temp 98.4 F (36.9 C)   Resp 16   Wt 82.1 kg   SpO2 95%   BMI 32.58 kg/m  Gen:   Awake, no distress   Resp:  Normal effort  MSK:   Moves extremities without difficulty  Other:  Reassuring neurological examination.  Patient does have multiple scattered abrasions to face.  Medical Decision Making  Medically screening exam initiated at 8:32 PM.  Appropriate orders placed.  Shevawn Langenberg was informed that the remainder of the evaluation will be completed by another provider, this initial triage assessment does not replace that evaluation, and the importance of remaining in the ED until their evaluation is complete.     Al Decant, PA-C 04/18/23 2032

## 2023-04-19 NOTE — ED Notes (Signed)
 Pt left d/t wait time

## 2023-04-20 ENCOUNTER — Other Ambulatory Visit (HOSPITAL_BASED_OUTPATIENT_CLINIC_OR_DEPARTMENT_OTHER): Payer: Self-pay

## 2023-04-20 ENCOUNTER — Other Ambulatory Visit: Payer: Self-pay

## 2023-04-21 ENCOUNTER — Other Ambulatory Visit (HOSPITAL_BASED_OUTPATIENT_CLINIC_OR_DEPARTMENT_OTHER): Payer: Self-pay

## 2023-04-22 ENCOUNTER — Other Ambulatory Visit (HOSPITAL_BASED_OUTPATIENT_CLINIC_OR_DEPARTMENT_OTHER): Payer: Self-pay

## 2023-04-22 ENCOUNTER — Other Ambulatory Visit: Payer: Self-pay

## 2023-05-11 ENCOUNTER — Other Ambulatory Visit (HOSPITAL_BASED_OUTPATIENT_CLINIC_OR_DEPARTMENT_OTHER): Payer: Self-pay

## 2023-05-11 MED ORDER — PRAVASTATIN SODIUM 80 MG PO TABS
80.0000 mg | ORAL_TABLET | Freq: Every day | ORAL | 1 refills | Status: AC
  Filled 2023-05-11: qty 90, 90d supply, fill #0

## 2023-05-12 ENCOUNTER — Other Ambulatory Visit (HOSPITAL_BASED_OUTPATIENT_CLINIC_OR_DEPARTMENT_OTHER): Payer: Self-pay

## 2023-05-16 ENCOUNTER — Other Ambulatory Visit (HOSPITAL_BASED_OUTPATIENT_CLINIC_OR_DEPARTMENT_OTHER): Payer: Self-pay

## 2023-05-16 ENCOUNTER — Other Ambulatory Visit: Payer: Self-pay

## 2023-05-16 MED ORDER — OXYCODONE HCL 10 MG PO TABS
10.0000 mg | ORAL_TABLET | Freq: Three times a day (TID) | ORAL | 0 refills | Status: DC | PRN
  Filled 2023-05-16 – 2023-05-18 (×3): qty 90, 30d supply, fill #0

## 2023-05-16 MED ORDER — NALOXONE HCL 4 MG/0.1ML NA LIQD
NASAL | 0 refills | Status: AC
  Filled 2023-05-16: qty 2, 1d supply, fill #0

## 2023-05-16 MED ORDER — BACLOFEN 5 MG PO TABS
5.0000 mg | ORAL_TABLET | Freq: Two times a day (BID) | ORAL | 0 refills | Status: DC | PRN
  Filled 2023-05-16: qty 60, 30d supply, fill #0

## 2023-05-16 MED ORDER — MORPHINE SULFATE ER 15 MG PO TBCR
15.0000 mg | EXTENDED_RELEASE_TABLET | Freq: Three times a day (TID) | ORAL | 0 refills | Status: DC
  Filled 2023-05-16: qty 90, 30d supply, fill #0

## 2023-05-18 ENCOUNTER — Other Ambulatory Visit (HOSPITAL_BASED_OUTPATIENT_CLINIC_OR_DEPARTMENT_OTHER): Payer: Self-pay

## 2023-05-18 ENCOUNTER — Other Ambulatory Visit: Payer: Self-pay

## 2023-05-21 ENCOUNTER — Other Ambulatory Visit (HOSPITAL_BASED_OUTPATIENT_CLINIC_OR_DEPARTMENT_OTHER): Payer: Self-pay

## 2023-05-30 ENCOUNTER — Other Ambulatory Visit (HOSPITAL_BASED_OUTPATIENT_CLINIC_OR_DEPARTMENT_OTHER): Payer: Self-pay

## 2023-06-06 ENCOUNTER — Other Ambulatory Visit (HOSPITAL_BASED_OUTPATIENT_CLINIC_OR_DEPARTMENT_OTHER): Payer: Self-pay

## 2023-06-06 MED ORDER — LEVOTHYROXINE SODIUM 75 MCG PO TABS
75.0000 ug | ORAL_TABLET | Freq: Every day | ORAL | 0 refills | Status: AC
Start: 2023-06-06 — End: ?
  Filled 2023-06-06: qty 90, 90d supply, fill #0
  Filled 2023-10-17: qty 3, 3d supply, fill #1

## 2023-06-06 MED ORDER — GABAPENTIN 800 MG PO TABS
800.0000 mg | ORAL_TABLET | Freq: Three times a day (TID) | ORAL | 3 refills | Status: AC
  Filled 2023-10-17: qty 270, 90d supply, fill #0
  Filled 2024-01-14 (×2): qty 270, 90d supply, fill #1
  Filled 2024-03-25: qty 270, 90d supply, fill #2

## 2023-06-08 ENCOUNTER — Other Ambulatory Visit (HOSPITAL_BASED_OUTPATIENT_CLINIC_OR_DEPARTMENT_OTHER): Payer: Self-pay

## 2023-06-10 ENCOUNTER — Other Ambulatory Visit: Payer: Self-pay

## 2023-06-10 ENCOUNTER — Encounter (HOSPITAL_COMMUNITY): Payer: Self-pay | Admitting: Emergency Medicine

## 2023-06-10 ENCOUNTER — Emergency Department (HOSPITAL_COMMUNITY)
Admission: EM | Admit: 2023-06-10 | Discharge: 2023-06-10 | Disposition: A | Discharge status: Home / Self Care | Attending: Emergency Medicine | Admitting: Emergency Medicine

## 2023-06-10 DIAGNOSIS — E119 Type 2 diabetes mellitus without complications: Secondary | ICD-10-CM | POA: Insufficient documentation

## 2023-06-10 DIAGNOSIS — R040 Epistaxis: Secondary | ICD-10-CM | POA: Diagnosis present

## 2023-06-10 LAB — CBC
HCT: 41.1 % (ref 36.0–46.0)
Hemoglobin: 13.3 g/dL (ref 12.0–15.0)
MCH: 30.5 pg (ref 26.0–34.0)
MCHC: 32.4 g/dL (ref 30.0–36.0)
MCV: 94.3 fL (ref 80.0–100.0)
Platelets: 217 10*3/uL (ref 150–400)
RBC: 4.36 MIL/uL (ref 3.87–5.11)
RDW: 13.2 % (ref 11.5–15.5)
WBC: 7.8 10*3/uL (ref 4.0–10.5)
nRBC: 0 % (ref 0.0–0.2)

## 2023-06-10 MED ORDER — TRANEXAMIC ACID FOR EPISTAXIS
500.0000 mg | Freq: Once | TOPICAL | Status: AC
  Administered 2023-06-10: 500 mg via TOPICAL

## 2023-06-10 MED ORDER — SALINE SPRAY 0.65 % NA SOLN
1.0000 | NASAL | 0 refills | Status: AC | PRN
Start: 2023-06-10 — End: ?

## 2023-06-10 MED ORDER — OXYMETAZOLINE HCL 0.05 % NA SOLN: 1.0000 | Freq: Two times a day (BID) | NASAL | 0 refills | Status: AC | PRN

## 2023-06-10 MED ORDER — OXYMETAZOLINE HCL 0.05 % NA SOLN
1.0000 | Freq: Once | NASAL | Status: AC
  Administered 2023-06-10: 1 via NASAL
  Filled 2023-06-10: qty 30

## 2023-06-10 NOTE — Discharge Instructions (Addendum)
 You were seen in the emergency department for your nosebleed.  We were able to control the bleeding with a topical clotting medication and you had no further bleeding in the ER.  Your blood work was normal.  You can use saline nasal spray to help keep your nose moist to prevent recurrent bleeds.  You can follow-up with your primary doctor or ENT as needed.  If you do have another bleed at home, you should blow out all the blood clots in your nose and then you can spray Afrin nasal spray 2 squirts in each nostril and hold pressure on the soft part of your nose for at least 15 to 20 minutes.  You should return to the emergency department if you have recurrent bleeding despite holding pressure, you become lightheaded or pass out or if you have any other new or concerning symptoms.

## 2023-06-10 NOTE — ED Triage Notes (Signed)
 Patient presents due to epistaxis for 2 hours. Bleeding not due to trauma. Patient is currently spitting out large clots. She is not on blood thinners.

## 2023-06-10 NOTE — ED Provider Notes (Addendum)
 [place] EMERGENCY DEPARTMENT AT [facility] Provider Note   CSN: [id number] Arrival date & time: 06/10/23  1713     History  Chief Complaint  Patient presents with   Epistaxis    Morgan Juarez is a 68 y.o. female.  Patient is a 68 year old female with a past medical history of ALL in remission, CIDP, diabetes, thrombocytopenia presenting to the emergency department with a nosebleed.  Patient states about 2 hours prior to arrival she was in the kitchen when she felt something dripping down her nose and noticed it was bleeding.  She states for 2 hours she tried to stop the bleeding with holding pressure, putting ice on your nose without any improvement and called 911.  She denies any blood thinner use.  She states that she is feeling a little bit lightheaded.  She denies any recent nose blowing or illnesses or any trauma to her nose.  The history is provided by the patient and the EMS personnel.  Epistaxis      Home Medications Prior to Admission medications   Medication Sig Start Date End Date Taking? Authorizing Provider  oxymetazoline  (AFRIN NASAL SPRAY) 0.05 % nasal spray Place 1 spray into both nostrils 2 (two) times daily as needed for congestion (Nosebleed). 06/10/23  Yes Nora Beal, Mert Dietrick K, DO  sodium chloride  (OCEAN) 0.65 % SOLN nasal spray Place 1 spray into both nostrils as needed for congestion. 06/10/23  Yes Kingsley, Shewanda Sharpe K, DO  acetaminophen  (TYLENOL ) 325 MG tablet Take 2 tablets (650 mg total) by mouth every 6 (six) hours as needed for mild pain or headache. 10/04/16   [name], MD  Baclofen  5 MG TABS Take 1 tablet (5 mg total) by mouth at bedtime. 03/24/22     Baclofen  5 MG TABS Take 1 tablet (5 mg total) by mouth at bedtime. 08/24/22     Baclofen  5 MG TABS Take 1 tablet (5 mg total) by mouth 2 (two) times daily as needed for muscle spasms 05/16/23     Calcium  Carb-Cholecalciferol 500-400 MG-UNIT CHEW  1 tablet, CHEWED, BID (2 times a day), 180  tablet, 0 Refill(s), Activate Med (Rx) 10/24/15   [provider]  calcium  carbonate (OS-CAL - DOSED IN MG OF ELEMENTAL CALCIUM ) 1250 (500 Ca) MG tablet Take 1 tablet (500 mg of elemental calcium  total) by mouth 3 (three) times daily with meals. 10/04/16   Krishnan, Gokul, MD  Calcium  Polycarbophil (FIBER) 625 MG TABS Take by mouth. 03/05/17   [provider]  carboxymethylcellulose (REFRESH PLUS) 0.5 % SOLN 1 Drop PRN for Discomfort.    [provider]  chlorthalidone  (HYGROTON ) 25 MG tablet Take 25 mg by mouth as needed. 08/21/21   [provider]  chlorthalidone  (HYGROTON ) 50 MG tablet Take 0.5-1 tablets (25-50 mg total) by mouth daily as needed for swelling. Do not start until after Furosemide prescription is complete. 07/31/22     chlorthalidone  (HYGROTON ) 50 MG tablet Take ONE-HALF to 1 tablets (25-50 mg total) by mouth as needed for swelling. Do NOT start until after Furosemide prescription is complete 04/18/23     cholecalciferol (VITAMIN D) 1000 units tablet Take 1,000 Units by mouth daily.    [provider]  cyanocobalamin 1000 MCG tablet Take by mouth.    [provider]  cycloSPORINE (RESTASIS) 0.05 % ophthalmic emulsion  11/24/15   [provider]  denosumab  (PROLIA ) 60 MG/ML SOSY injection Inject 1 mL into the skin every six months 07/31/22  denosumab  (PROLIA ) 60 MG/ML SOSY injection Inject 60 mg into the skin every 6 (six) months. 12/09/22     denosumab  (PROLIA ) 60 MG/ML SOSY injection Inject 60 mg into the skin every 6 (six) months. 02/09/23     DEXILANT 60 MG capsule Take 1 capsule by mouth daily. 10/06/19   [provider]  Docusate Sodium (DSS) 100 MG CAPS Take by mouth.    [provider]  DULoxetine  (CYMBALTA ) 30 MG capsule Take 1 capsule (30 mg total) by mouth daily. 10/05/16   Krishnan, Gokul, MD  DULoxetine  (CYMBALTA ) 30 MG capsule Take 1 capsule (30 mg total) by mouth every evening. 02/08/22      DULoxetine  (CYMBALTA ) 30 MG capsule Take 1 capsule by mouth every evening 04/09/22     DULoxetine  (CYMBALTA ) 30 MG capsule Take 1 capsule (30 mg total) by mouth every evening. 07/31/22     DULoxetine  (CYMBALTA ) 60 MG capsule Take 60 mg by mouth daily. Patient not taking: Reported on 02/03/2022    [provider]  DULoxetine  (CYMBALTA ) 60 MG capsule Take 1 capsule (60 mg total) by mouth daily. 10/05/16   Krishnan, Gokul, MD  DULoxetine  (CYMBALTA ) 60 MG capsule Take 1 capsule (60 mg total) by mouth in the morning. 10/22/22     famotidine  (PEPCID ) 40 MG tablet Take 40 mg by mouth at bedtime. 10/13/19   [provider]  famotidine  (PEPCID ) 40 MG tablet Take 1 tablet (40 mg total) by mouth at bedtime. 03/03/23     fluconazole  (DIFLUCAN ) 200 MG tablet Take 1 tablet (200 mg total) by mouth daily. 10/22/22     gabapentin  (NEURONTIN ) 300 MG capsule Take 3 capsules (900 mg total) by mouth 3 (three) times daily for neuropathy. 02/24/22     gabapentin  (NEURONTIN ) 300 MG capsule Take 3 capsules (900 mg total) by mouth 3 (three) times daily for neuropathy. 03/26/22     gabapentin  (NEURONTIN ) 300 MG capsule Take 3 capsules (900 mg total) by mouth 3 (three) times daily. 04/26/22     gabapentin  (NEURONTIN ) 300 MG capsule Take 3 capsules (900 mg total) by mouth daily for neuropathy. 05/25/22     gabapentin  (NEURONTIN ) 400 MG capsule Take 2 capsules (800 mg total) by mouth 2 (two) times daily. Patient taking differently: Take 800 mg by mouth 3 (three) times daily. 10/04/16   Krishnan, Gokul, MD  gabapentin  (NEURONTIN ) 800 MG tablet Take 1 tablet (800 mg total) by mouth 3 (three) times daily. 06/07/22     gabapentin  (NEURONTIN ) 800 MG tablet Take 1 tablet (800 mg total) by mouth 3 (three) times daily. 12/06/22     gabapentin  (NEURONTIN ) 800 MG tablet Take 1 tablet (800 mg total) by mouth 3 (three) times daily. 06/06/23     GAMMAGARD 20 GM/200ML SOLN  10/28/21   [provider]  Heparin  Sod, Pork, Lock Flush  (HEPARIN  FLUSH) 10 UNIT/ML injection Inject into the vein. 10/28/21   [provider]  levothyroxine  (SYNTHROID ) 75 MCG tablet Take 1 tablet (75 mcg total) by mouth daily. 01/13/22   Shamleffer, Ibtehal Jaralla, MD  levothyroxine  (SYNTHROID ) 75 MCG tablet Take 1 tablet (75 mcg total) by mouth every morning at 6am. 07/31/22     levothyroxine  (SYNTHROID ) 75 MCG tablet Take 1 tablet (75 mcg total) by mouth daily. 06/06/23     lidocaine  (XYLOCAINE ) 2 % solution Use as directed 15 mLs in the mouth or throat as needed for Pain (Swish, hold, and spit as needed for mouth sores/pain.). 11/07/18   [provider]  linaclotide  (LINZESS ) 145 MCG CAPS capsule Take 1 capsule (145 mcg total) by mouth daily. 04/16/22     linaclotide  (LINZESS ) 145 MCG CAPS capsule Take 1 capsule (145 mcg total) by mouth daily. 04/26/22     linaclotide  (LINZESS ) 145 MCG CAPS capsule Take 1 capsule (145 mcg total) by mouth daily. 05/25/22     linaclotide  (LINZESS ) 145 MCG CAPS capsule Take 1 capsule (145 mcg total) by mouth daily. 08/24/22     linaclotide  (LINZESS ) 145 MCG CAPS capsule Take 1 capsule (145 mcg total) by mouth daily. 02/09/23     Liraglutide  -Weight Management (SAXENDA ) 18 MG/3ML SOPN Inject 3 mg into the skin daily. 12/28/21     Liraglutide  -Weight Management (SAXENDA ) 18 MG/3ML SOPN Inject 3 mg into the skin daily. 02/08/22     Liraglutide  -Weight Management (SAXENDA ) 18 MG/3ML SOPN Inject 3 mg into the skin daily. 03/16/22     Liraglutide  -Weight Management (SAXENDA ) 18 MG/3ML SOPN Inject 3 mg into the skin daily. 07/31/22     Liraglutide  -Weight Management (SAXENDA ) 18 MG/3ML SOPN 3 (three) Milligram daily injection under skin 08/28/22     Liraglutide  -Weight Management (SAXENDA ) 18 MG/3ML SOPN Inject 3 mg into the skin daily. 10/22/22     Liraglutide  -Weight Management (SAXENDA ) 18 MG/3ML SOPN Inject 3 mg into the skin daily. 12/09/22     Liraglutide  -Weight Management (SAXENDA ) 18 MG/3ML SOPN Inject 3 mg into the  skin daily. 12/19/22     Liraglutide  -Weight Management (SAXENDA ) 18 MG/3ML SOPN Inject 3 mg into the skin daily. 01/17/23     Liraglutide  -Weight Management (SAXENDA ) 18 MG/3ML SOPN Inject 3 mg into the skin daily. 02/09/23     Liraglutide  -Weight Management (SAXENDA ) 18 MG/3ML SOPN Inject 3 mg into the skin daily. 03/21/23     Liraglutide  -Weight Management (SAXENDA ) 18 MG/3ML SOPN Inject 3 mg into the skin daily. 04/18/23     magnesium  oxide (MAG-OX) 400 MG tablet Take by mouth. 03/31/17   [provider]  melatonin (MELATONIN MAXIMUM STRENGTH) 5 MG TABS Take 1-2 tablets (5-10 mg total) by mouth at bedtime. 12/12/22     melatonin (MELATONIN MAXIMUM STRENGTH) 5 MG TABS Take 1-2 tablets (5-10 mg total) by mouth at bedtime. 01/12/23     melatonin (MELATONIN MAXIMUM STRENGTH) 5 MG TABS Take 1-2 tablets (5-10 mg total) by mouth at bedtime. 01/12/23     melatonin (MELATONIN MAXIMUM STRENGTH) 5 MG TABS Take 1-2 tablets (5-10 mg total) by mouth at bedtime. 03/16/23     Melatonin 5 MG CAPS 1 TO 2 TABLET BY MOUTH AT BEDTIME 07/28/19   [provider]  metFORMIN (GLUCOPHAGE-XR) 500 MG 24 hr tablet Take 500 mg by mouth every morning. 09/23/21   [provider]  morphine  (MS CONTIN ) 15 MG 12 hr tablet Take by mouth. 02/22/19   [provider]  morphine  (MS CONTIN ) 15 MG 12 hr tablet Take 1 tablet (15 mg total) by mouth 3 (three) times daily. 05/16/23     Multiple Vitamins-Iron (MULTIVITAMINS WITH IRON) TABS tablet Take 1 tablet by mouth daily.    [provider]  Naldemedine Tosylate  (SYMPROIC ) 0.2 MG TABS Take 1 tablet by mouth daily. 01/04/20   [provider]  Naldemedine Tosylate  (SYMPROIC ) 0.2 MG TABS Take 1 tablet (0.2 mg) by mouth daily. 04/16/22     Naldemedine Tosylate  (SYMPROIC ) 0.2 MG TABS Take 1 tablet by mouth daily. 02/09/23     naloxone  (NARCAN ) nasal spray 4 mg/0.1  mL  07/18/19   [provider]  naloxone  (NARCAN ) nasal spray 4 mg/0.1 mL  Place 1 spray into the nose as needed. 11/19/22     naloxone  (NARCAN ) nasal spray 4 mg/0.1 mL Use as directed as needed for accidental overdose 05/16/23     nystatin  cream (MYCOSTATIN ) APPLY TO AFFECTED AREA TWICE A DAY 08/14/19   [provider]  nystatin  ointment (MYCOSTATIN ) Apply topically to affected area(s) 2 (two) times daily. 10/22/22     ondansetron  (ZOFRAN ) 4 MG tablet Take 1 tablet (4 mg total) by mouth 3 (three) times daily as needed for nausea. 07/31/22     ondansetron  (ZOFRAN ) 4 MG tablet Take 1 tablet (4 mg total) by mouth 3 (three) times daily as needed for nausea. 12/09/22     ondansetron  (ZOFRAN ) 4 MG tablet Take 1 tablet (4 mg total) by mouth 3 (three) times daily as needed for nausea. 02/09/23     ondansetron  (ZOFRAN ) 4 MG/2ML SOLN injection Inject 2 mLs (4 mg total) into the vein every 6 (six) hours as needed for nausea or vomiting. 10/04/16   [name], MD  oxyCODONE  (OXY IR/ROXICODONE ) 5 MG immediate release tablet Take by mouth. 10/20/18   [provider]  Oxycodone  HCl 10 MG TABS Take 1 tablet (10 mg total) by mouth 3 (three) times daily as needed. 08/24/22     Oxycodone  HCl 10 MG TABS Take 1 tablet (10 mg total) by mouth 3 (three) times daily as needed. 09/21/22     Oxycodone  HCl 10 MG TABS Take 1 tablet (10 mg total) by mouth 3 (three) times daily as needed. 10/18/22     Oxycodone  HCl 10 MG TABS Take 1 tablet (10 mg total) by mouth 3 (three) times daily as needed. 11/19/22     Oxycodone  HCl 10 MG TABS Take 1 tablet (10 mg total) by mouth 3 (three) times daily as needed. 05/16/23     pantoprazole  (PROTONIX ) 40 MG tablet Take 1 tablet (40 mg total) by mouth 2 (two) times daily. 02/09/23     phentermine  (ADIPEX-P ) 37.5 MG tablet Take 1 tablet (37.5 mg total) by mouth daily. 12/19/22     phentermine  (ADIPEX-P ) 37.5 MG tablet Take 1 tablet (37.5 mg total) by mouth daily. 02/09/23     phentermine  (ADIPEX-P ) 37.5 MG tablet Take 1 tablet (37.5 mg total) by mouth daily.  03/21/23     phentermine  (ADIPEX-P ) 37.5 MG tablet Take 1 tablet (37.5 mg total) by mouth daily. 04/18/23     phentermine  15 MG capsule Take 1 capsule (15 mg total) by mouth daily. 03/22/22     phentermine  15 MG capsule Take 1 capsule (15 mg total) by mouth daily. 07/31/22     phentermine  30 MG capsule 1 (one) capsule by mouth daily 08/28/22     potassium chloride  (MICRO-K ) 10 MEQ CR capsule Take by mouth. 11/08/19   [provider]  pravastatin  (PRAVACHOL ) 40 MG tablet Take 40 mg by mouth daily. 01/08/22   [provider]  pravastatin  (PRAVACHOL ) 80 MG tablet Take 1 tablet (80 mg total) by mouth daily. 05/11/23     promethazine (PHENERGAN) 12.5 MG tablet Take 12.5 mg by mouth every 4 (four) hours as needed (motion sickness).    [provider]  QUEtiapine  (SEROQUEL ) 25 MG tablet Take 1 tablet (25 mg total) by mouth in the morning. 01/12/23     QUEtiapine  (SEROQUEL ) 25 MG tablet Take 1 tablet (25 mg total) by mouth in the morning. 01/12/23  QUEtiapine  (SEROQUEL ) 25 MG tablet Take 1 tablet (25 mg total) by mouth in the morning. 03/16/23     QUEtiapine  (SEROQUEL ) 50 MG tablet Take 1 tablet (50 mg total) by mouth at night 1-2 hours before bedtime. 01/12/23     QUEtiapine  (SEROQUEL ) 50 MG tablet Take 1 tablet (50 mg total) by mouth 1-2 hours before bedtime. 01/12/23     QUEtiapine  (SEROQUEL ) 50 MG tablet Take 1 tablet (50 mg total) by mouth 1-2 hours before bedtime. 03/16/23     SAXENDA  18 MG/3ML SOPN Inject 3 mg into the skin daily. 01/19/22     sertraline  (ZOLOFT ) 100 MG tablet Take 1 tablet (100 mg total) by mouth daily. 07/31/22     sertraline  (ZOLOFT ) 100 MG tablet Take 1 tablet (100 mg total) by mouth daily. 01/12/23     sertraline  (ZOLOFT ) 100 MG tablet Take 1 tablet (100 mg total) by mouth daily. 01/12/23     sertraline  (ZOLOFT ) 100 MG tablet Take 1 tablet (100 mg total) by mouth daily. 03/16/23     sertraline  (ZOLOFT ) 50 MG tablet Take 75 mg by mouth daily. 10/24/21   [provider]  sodium chloride  0.9 % infusion Inject 1,000 mLs into the vein continuous. 10/04/16   Krishnan, Gokul, MD  sodium chloride  flush (NS) 0.9 % SOLN Inject 3 mLs into the vein every 12 (twelve) hours. 10/04/16   Krishnan, Gokul, MD  sodium chloride  flush (NS) 0.9 % SOLN Inject 3 mLs into the vein as needed. 10/04/16   Krishnan, Gokul, MD  sodium chloride  flush (NS) 0.9 % SOLN 10-40 mLs by Intracatheter route as needed (flush). 10/04/16   Krishnan, Gokul, MD  tiZANidine  (ZANAFLEX ) 4 MG tablet Take 1 tablet (4 mg total) by mouth 3 (three) times daily. 12/16/22     torsemide (DEMADEX) 5 MG tablet Take 5 mg by mouth daily. 10/19/21   [provider]  valACYclovir  (VALTREX ) 500 MG tablet Take by mouth.    [provider]  valACYclovir  (VALTREX ) 500 MG tablet Take 1 tablet (500 mg total) by mouth 2 (two) times daily. 04/26/22     valACYclovir  (VALTREX ) 500 MG tablet Take 1 tablet (500 mg total) by mouth 2 (two) times daily. 05/25/22     valACYclovir  (VALTREX ) 500 MG tablet Take 1 tablet (500 mg total) by mouth 2 (two) times daily. 07/31/22     valACYclovir  (VALTREX ) 500 MG tablet Take 1 tablet (500 mg total) by mouth 2 (two) times daily. 12/09/22     valACYclovir  (VALTREX ) 500 MG tablet Take 1 tablet (500 mg total) by mouth 2 (two) times daily. 02/09/23     valACYclovir  (VALTREX ) 500 MG tablet Take 1 tablet (500 mg total) by mouth 2 (two) times daily. 03/21/23         Allergies    Fluogen [influenza virus vaccine] and Sulfamethoxazole -trimethoprim     Review of Systems   Review of Systems  HENT:  Positive for nosebleeds.     Physical Exam Updated Vital Signs BP 94/76 (BP Location: Right Arm)   Pulse (!) 113   Temp 98.3 F (36.8 C) (Oral)   Resp 18   SpO2 96%  Physical Exam Vitals and nursing note reviewed.  Constitutional:      General: She is not in acute distress.    Appearance: Normal appearance.  HENT:     Head: Normocephalic and atraumatic.     Nose:      Comments: Significant oozing from L anterior kisselbach's plexus, small amount of  blood in the R nare, no visible active bleeding    Mouth/Throat:     Mouth: Mucous membranes are moist.     Comments: Blood in posterior oropharynx, no active oozing down throat Eyes:     Extraocular Movements: Extraocular movements intact.     Conjunctiva/sclera: Conjunctivae normal.  Cardiovascular:     Rate and Rhythm: Regular rhythm. Tachycardia present.  Pulmonary:     Effort: Pulmonary effort is normal.  Abdominal:     General: Abdomen is flat.  Musculoskeletal:        General: Normal range of motion.     Cervical back: Normal range of motion.  Skin:    General: Skin is warm and dry.  Neurological:     General: No focal deficit present.     Mental Status: She is alert and oriented to person, place, and time.  Psychiatric:        Mood and Affect: Mood normal.        Behavior: Behavior normal.     ED Results / Procedures / Treatments   Labs (all labs ordered are listed, but only abnormal results are displayed) Labs Reviewed  CBC    EKG None  Radiology No results found.  Procedures Epistaxis Management  Date/Time: 06/10/2023 6:06 PM  Performed by: Kingsley, Laurna Shetley K, DO Authorized by: Nolberto Batty, DO   Consent:    Consent obtained:  Verbal   Consent given by:  Patient   Risks, benefits, and alternatives were discussed: yes     Risks discussed:  Bleeding and infection   Alternatives discussed:  No treatment, delayed treatment and alternative treatment Universal protocol:    Procedure explained and questions answered to patient or proxy's satisfaction: yes     Patient identity confirmed:  Verbally with patient Anesthesia:    Anesthesia method:  None Procedure details:    Treatment site:  L anterior and R anterior   Treatment method:  Thrombin   Treatment complexity:  Extensive   Treatment episode: initial   Post-procedure details:    Assessment:  Bleeding  stopped   Procedure completion:  Tolerated well, no immediate complications     Medications Ordered in ED Medications  oxymetazoline  (AFRIN) 0.05 % nasal spray 1 spray (1 spray Each Nare Given 06/10/23 1729)  tranexamic acid  (CYKLOKAPRON ) 1000 MG/10ML topical solution 500 mg (500 mg Topical Given 06/10/23 1801)    ED Course/ Medical Decision Making/ A&P Clinical Course as of 06/10/23 2009  Fri Jun 10, 2023  1822 Bleeding has stopped after TXA and holding pressure for 15-20 min. Will observe for ~1 hr to ensure bleeding doesn't recur. Suctioned out mouth and no bleeding in posterior oropharynx. [VK]  2004 No further bleeding in the ED, patient is stable for discharge home with outpatient follow up. [VK]    Clinical Course User Index [VK] Kingsley, Geffrey Michaelsen K, DO                                 Medical Decision Making This patient presents to the ED with chief complaint(s) of epistaxis with pertinent past medical history of ALL in remission, thrombocytopenia, CIDP which further complicates the presenting complaint. The complaint involves an extensive differential diagnosis and also carries with it a high risk of complications and morbidity.    The differential diagnosis includes anemia, coagulapathy, anterior vs posterior bleed   Additional history obtained: Additional history obtained from EMS  Records  reviewed Care Everywhere/External Records  ED Course and Reassessment: On patient's arrival she is tachycardic with having active bleeding from large areas otherwise in no acute distress.  Did have patient blow her nose and blow out all the clots and green bilateral naris with Afrin.  Does have some blood in the posterior oropharynx but do not see any active bleeding.  Will have patient hold pressure in her nose for 15 to 20 minutes and reassess for subsequent interventions.  CBC was sent to evaluate for anemia or coagulopathy.  Independent labs interpretation:  The following labs were  independently interpreted: within normal range  Independent visualization of imaging: - N/A  Consultation: - Consulted or discussed management/test interpretation w/ external professional: N/A  Consideration for admission or further workup: Patient has no emergent conditions requiring admission or further work-up at this time and is stable for discharge home with primary care and ENT as needed follow-up  Social Determinants of health: N/A    Amount and/or Complexity of Data Reviewed Labs: ordered.  Risk OTC drugs.          Final Clinical Impression(s) / ED Diagnoses Final diagnoses:  Left-sided epistaxis    Rx / DC Orders ED Discharge Orders          Ordered    oxymetazoline  (AFRIN NASAL SPRAY) 0.05 % nasal spray  2 times daily PRN        06/10/23 2007    sodium chloride  (OCEAN) 0.65 % SOLN nasal spray  As needed        06/10/23 2007              Kingsley, Debroh Sieloff K, DO 06/10/23 2006    Kingsley, Mamoru Takeshita K, DO 06/10/23 2009

## 2023-06-13 ENCOUNTER — Other Ambulatory Visit (HOSPITAL_BASED_OUTPATIENT_CLINIC_OR_DEPARTMENT_OTHER): Payer: Self-pay

## 2023-06-13 ENCOUNTER — Other Ambulatory Visit: Payer: Self-pay

## 2023-06-13 MED ORDER — SAXENDA 18 MG/3ML ~~LOC~~ SOPN
3.0000 mg | PEN_INJECTOR | Freq: Every day | SUBCUTANEOUS | 3 refills | Status: AC
Start: 2023-06-13 — End: ?
  Filled 2023-06-13: qty 15, 30d supply, fill #0

## 2023-06-13 MED ORDER — CHLORTHALIDONE 50 MG PO TABS
25.0000 mg | ORAL_TABLET | Freq: Every day | ORAL | 2 refills | Status: AC | PRN
Start: 2023-06-13 — End: ?
  Filled 2023-06-13: qty 30, 30d supply, fill #0

## 2023-06-13 MED ORDER — MORPHINE SULFATE ER 15 MG PO TBCR
15.0000 mg | EXTENDED_RELEASE_TABLET | Freq: Three times a day (TID) | ORAL | 0 refills | Status: DC
  Filled 2023-06-13: qty 90, 30d supply, fill #0

## 2023-06-13 MED ORDER — PANTOPRAZOLE SODIUM 40 MG PO TBEC
40.0000 mg | DELAYED_RELEASE_TABLET | Freq: Two times a day (BID) | ORAL | 0 refills | Status: AC
Start: 2023-06-13 — End: ?
  Filled 2023-06-13: qty 180, 90d supply, fill #0

## 2023-06-13 MED ORDER — OXYCODONE HCL 10 MG PO TABS
10.0000 mg | ORAL_TABLET | Freq: Three times a day (TID) | ORAL | 0 refills | Status: DC | PRN
  Filled 2023-06-13 – 2023-06-18 (×2): qty 90, 30d supply, fill #0

## 2023-06-13 MED ORDER — PHENTERMINE HCL 37.5 MG PO TABS
37.5000 mg | ORAL_TABLET | Freq: Every day | ORAL | 0 refills | Status: AC
  Filled 2023-06-13: qty 30, 30d supply, fill #0

## 2023-06-14 ENCOUNTER — Other Ambulatory Visit (HOSPITAL_BASED_OUTPATIENT_CLINIC_OR_DEPARTMENT_OTHER): Payer: Self-pay

## 2023-06-15 ENCOUNTER — Other Ambulatory Visit (HOSPITAL_BASED_OUTPATIENT_CLINIC_OR_DEPARTMENT_OTHER): Payer: Self-pay

## 2023-06-17 ENCOUNTER — Other Ambulatory Visit (HOSPITAL_BASED_OUTPATIENT_CLINIC_OR_DEPARTMENT_OTHER): Payer: Self-pay

## 2023-06-18 ENCOUNTER — Other Ambulatory Visit (HOSPITAL_BASED_OUTPATIENT_CLINIC_OR_DEPARTMENT_OTHER): Payer: Self-pay

## 2023-06-29 ENCOUNTER — Other Ambulatory Visit: Payer: Self-pay

## 2023-06-29 ENCOUNTER — Other Ambulatory Visit (HOSPITAL_BASED_OUTPATIENT_CLINIC_OR_DEPARTMENT_OTHER): Payer: Self-pay

## 2023-06-29 MED ORDER — QUETIAPINE FUMARATE 50 MG PO TABS
50.0000 mg | ORAL_TABLET | Freq: Every day | ORAL | 2 refills | Status: AC
  Filled 2023-06-29: qty 30, 30d supply, fill #0
  Filled 2023-08-17: qty 30, 30d supply, fill #1

## 2023-06-29 MED ORDER — SERTRALINE HCL 100 MG PO TABS: 100.0000 mg | ORAL_TABLET | Freq: Every day | ORAL | 2 refills | Status: AC

## 2023-06-29 MED ORDER — SERTRALINE HCL 100 MG PO TABS
150.0000 mg | ORAL_TABLET | Freq: Every day | ORAL | 2 refills | Status: AC
  Filled 2023-07-20 – 2023-08-16 (×3): qty 135, 90d supply, fill #0
  Filled 2023-11-24: qty 135, 90d supply, fill #1
  Filled 2024-01-05 – 2024-02-17 (×2): qty 135, 90d supply, fill #2

## 2023-06-29 MED ORDER — QUETIAPINE FUMARATE 25 MG PO TABS
25.0000 mg | ORAL_TABLET | Freq: Every morning | ORAL | 2 refills | Status: AC
  Filled 2023-06-29: qty 30, 30d supply, fill #0
  Filled 2023-08-17: qty 30, 30d supply, fill #1
  Filled 2023-11-16: qty 30, 30d supply, fill #2

## 2023-06-29 MED ORDER — MELATONIN 5 MG PO TABS
5.0000 mg | ORAL_TABLET | Freq: Every day | ORAL | 2 refills | Status: AC
  Filled 2023-06-29: qty 90, 45d supply, fill #0

## 2023-07-01 ENCOUNTER — Other Ambulatory Visit: Payer: Self-pay

## 2023-07-11 ENCOUNTER — Other Ambulatory Visit (HOSPITAL_BASED_OUTPATIENT_CLINIC_OR_DEPARTMENT_OTHER): Payer: Self-pay

## 2023-07-11 MED ORDER — BACLOFEN 5 MG PO TABS
5.0000 mg | ORAL_TABLET | Freq: Two times a day (BID) | ORAL | 0 refills | Status: DC | PRN
  Filled 2023-07-11: qty 60, 30d supply, fill #0

## 2023-07-11 MED ORDER — PHENTERMINE HCL 37.5 MG PO TABS
37.5000 mg | ORAL_TABLET | Freq: Every day | ORAL | 0 refills | Status: DC
  Filled 2023-07-11 – 2023-07-12 (×2): qty 30, 30d supply, fill #0

## 2023-07-11 MED ORDER — OXYCODONE HCL 10 MG PO TABS
10.0000 mg | ORAL_TABLET | Freq: Three times a day (TID) | ORAL | 0 refills | Status: DC | PRN
Start: 2023-07-11 — End: 2023-08-11
  Filled 2023-07-11 – 2023-07-20 (×2): qty 90, 30d supply, fill #0

## 2023-07-11 MED ORDER — CHLORTHALIDONE 50 MG PO TABS
25.0000 mg | ORAL_TABLET | ORAL | 2 refills | Status: AC | PRN
  Filled 2023-07-11: qty 30, 30d supply, fill #0

## 2023-07-11 MED ORDER — MORPHINE SULFATE ER 15 MG PO TBCR
15.0000 mg | EXTENDED_RELEASE_TABLET | Freq: Three times a day (TID) | ORAL | 0 refills | Status: DC
Start: 2023-07-11 — End: 2023-08-11
  Filled 2023-07-11: qty 90, 30d supply, fill #0

## 2023-07-12 ENCOUNTER — Other Ambulatory Visit: Payer: Self-pay

## 2023-07-12 ENCOUNTER — Other Ambulatory Visit (HOSPITAL_BASED_OUTPATIENT_CLINIC_OR_DEPARTMENT_OTHER): Payer: Self-pay

## 2023-07-13 ENCOUNTER — Other Ambulatory Visit (HOSPITAL_BASED_OUTPATIENT_CLINIC_OR_DEPARTMENT_OTHER): Payer: Self-pay

## 2023-07-13 ENCOUNTER — Other Ambulatory Visit: Payer: Self-pay

## 2023-07-20 ENCOUNTER — Other Ambulatory Visit (HOSPITAL_BASED_OUTPATIENT_CLINIC_OR_DEPARTMENT_OTHER): Payer: Self-pay

## 2023-07-23 ENCOUNTER — Other Ambulatory Visit (HOSPITAL_BASED_OUTPATIENT_CLINIC_OR_DEPARTMENT_OTHER): Payer: Self-pay

## 2023-08-11 ENCOUNTER — Other Ambulatory Visit: Payer: Self-pay

## 2023-08-11 ENCOUNTER — Other Ambulatory Visit (HOSPITAL_BASED_OUTPATIENT_CLINIC_OR_DEPARTMENT_OTHER): Payer: Self-pay

## 2023-08-11 MED ORDER — VALACYCLOVIR HCL 500 MG PO TABS
500.0000 mg | ORAL_TABLET | Freq: Two times a day (BID) | ORAL | 3 refills | Status: AC
  Filled 2023-08-11: qty 20, 10d supply, fill #0
  Filled 2023-11-16: qty 20, 10d supply, fill #1
  Filled 2024-02-24: qty 20, 10d supply, fill #2
  Filled 2024-03-25: qty 20, 10d supply, fill #3

## 2023-08-11 MED ORDER — MORPHINE SULFATE ER 15 MG PO TBCR
15.0000 mg | EXTENDED_RELEASE_TABLET | Freq: Three times a day (TID) | ORAL | 0 refills | Status: DC
  Filled 2023-08-13: qty 90, 30d supply, fill #0

## 2023-08-11 MED ORDER — BACLOFEN 5 MG PO TABS
5.0000 mg | ORAL_TABLET | Freq: Two times a day (BID) | ORAL | 0 refills | Status: DC | PRN
  Filled 2023-08-11: qty 60, 30d supply, fill #0

## 2023-08-11 MED ORDER — PHENTERMINE HCL 37.5 MG PO TABS
37.5000 mg | ORAL_TABLET | Freq: Every day | ORAL | 0 refills | Status: DC
  Filled 2023-08-13: qty 30, 30d supply, fill #0

## 2023-08-11 MED ORDER — PRAVASTATIN SODIUM 80 MG PO TABS
80.0000 mg | ORAL_TABLET | Freq: Every day | ORAL | 1 refills | Status: AC
  Filled 2023-08-11: qty 90, 90d supply, fill #0
  Filled 2023-11-21: qty 90, 90d supply, fill #1

## 2023-08-11 MED ORDER — ONDANSETRON HCL 4 MG PO TABS
4.0000 mg | ORAL_TABLET | Freq: Three times a day (TID) | ORAL | 3 refills | Status: AC | PRN
  Filled 2023-08-11: qty 90, 30d supply, fill #0
  Filled 2023-11-16: qty 90, 30d supply, fill #1
  Filled 2024-03-25: qty 90, 30d supply, fill #2

## 2023-08-11 MED ORDER — NYSTATIN 100000 UNIT/GM EX OINT
1.0000 | TOPICAL_OINTMENT | Freq: Two times a day (BID) | CUTANEOUS | 3 refills | Status: AC
  Filled 2023-08-11: qty 30, 60d supply, fill #0

## 2023-08-11 MED ORDER — OXYCODONE HCL 10 MG PO TABS
10.0000 mg | ORAL_TABLET | Freq: Three times a day (TID) | ORAL | 0 refills | Status: DC | PRN
  Filled 2023-08-13 – 2023-08-20 (×2): qty 90, 30d supply, fill #0

## 2023-08-11 MED ORDER — CHLORTHALIDONE 50 MG PO TABS
25.0000 mg | ORAL_TABLET | Freq: Every day | ORAL | 2 refills | Status: AC | PRN
  Filled 2023-08-11: qty 30, 30d supply, fill #0
  Filled 2023-09-21: qty 30, 30d supply, fill #1
  Filled 2023-11-16: qty 30, 30d supply, fill #2

## 2023-08-13 ENCOUNTER — Other Ambulatory Visit (HOSPITAL_BASED_OUTPATIENT_CLINIC_OR_DEPARTMENT_OTHER): Payer: Self-pay

## 2023-08-16 ENCOUNTER — Other Ambulatory Visit: Payer: Self-pay

## 2023-08-16 ENCOUNTER — Other Ambulatory Visit (HOSPITAL_BASED_OUTPATIENT_CLINIC_OR_DEPARTMENT_OTHER): Payer: Self-pay

## 2023-08-18 ENCOUNTER — Other Ambulatory Visit (HOSPITAL_BASED_OUTPATIENT_CLINIC_OR_DEPARTMENT_OTHER): Payer: Self-pay

## 2023-08-20 ENCOUNTER — Other Ambulatory Visit (HOSPITAL_BASED_OUTPATIENT_CLINIC_OR_DEPARTMENT_OTHER): Payer: Self-pay

## 2023-08-22 ENCOUNTER — Other Ambulatory Visit: Payer: Self-pay

## 2023-08-24 ENCOUNTER — Other Ambulatory Visit (HOSPITAL_COMMUNITY): Payer: Self-pay

## 2023-08-24 ENCOUNTER — Other Ambulatory Visit: Payer: Self-pay

## 2023-08-29 ENCOUNTER — Other Ambulatory Visit (HOSPITAL_COMMUNITY): Payer: Self-pay

## 2023-08-31 ENCOUNTER — Other Ambulatory Visit: Payer: Self-pay

## 2023-09-09 ENCOUNTER — Other Ambulatory Visit (HOSPITAL_BASED_OUTPATIENT_CLINIC_OR_DEPARTMENT_OTHER): Payer: Self-pay

## 2023-09-09 MED ORDER — LEVOTHYROXINE SODIUM 75 MCG PO TABS
75.0000 ug | ORAL_TABLET | Freq: Every day | ORAL | 0 refills | Status: DC
  Filled 2023-09-09: qty 90, 90d supply, fill #0

## 2023-09-16 ENCOUNTER — Other Ambulatory Visit (HOSPITAL_BASED_OUTPATIENT_CLINIC_OR_DEPARTMENT_OTHER): Payer: Self-pay

## 2023-09-16 MED ORDER — OXYCODONE HCL 10 MG PO TABS
10.0000 mg | ORAL_TABLET | Freq: Three times a day (TID) | ORAL | 0 refills | Status: DC | PRN
  Filled 2023-09-21: qty 90, 30d supply, fill #0

## 2023-09-16 MED ORDER — BACLOFEN 5 MG PO TABS
5.0000 mg | ORAL_TABLET | Freq: Two times a day (BID) | ORAL | 0 refills | Status: DC | PRN
  Filled 2023-09-16: qty 60, 30d supply, fill #0

## 2023-09-16 MED ORDER — MORPHINE SULFATE ER 15 MG PO TBCR
15.0000 mg | EXTENDED_RELEASE_TABLET | Freq: Three times a day (TID) | ORAL | 0 refills | Status: DC
  Filled 2023-09-16: qty 90, 30d supply, fill #0

## 2023-09-16 MED ORDER — GABAPENTIN 300 MG PO CAPS
900.0000 mg | ORAL_CAPSULE | Freq: Three times a day (TID) | ORAL | 6 refills | Status: AC
  Filled 2023-09-16: qty 270, 30d supply, fill #0
  Filled 2023-10-17: qty 270, 30d supply, fill #1

## 2023-09-19 ENCOUNTER — Other Ambulatory Visit (HOSPITAL_BASED_OUTPATIENT_CLINIC_OR_DEPARTMENT_OTHER): Payer: Self-pay

## 2023-09-21 ENCOUNTER — Other Ambulatory Visit (HOSPITAL_BASED_OUTPATIENT_CLINIC_OR_DEPARTMENT_OTHER): Payer: Self-pay

## 2023-09-28 ENCOUNTER — Other Ambulatory Visit: Payer: Self-pay

## 2023-09-28 ENCOUNTER — Other Ambulatory Visit (HOSPITAL_BASED_OUTPATIENT_CLINIC_OR_DEPARTMENT_OTHER): Payer: Self-pay

## 2023-09-28 MED ORDER — MELATONIN 5 MG PO TABS
5.0000 mg | ORAL_TABLET | Freq: Every evening | ORAL | 2 refills | Status: AC
  Filled 2023-09-28: qty 60, 30d supply, fill #0
  Filled 2023-11-21: qty 60, 30d supply, fill #1

## 2023-09-28 MED ORDER — SERTRALINE HCL 100 MG PO TABS: 100.0000 mg | ORAL_TABLET | Freq: Every day | ORAL | 0 refills | Status: AC

## 2023-10-01 ENCOUNTER — Other Ambulatory Visit (HOSPITAL_BASED_OUTPATIENT_CLINIC_OR_DEPARTMENT_OTHER): Payer: Self-pay

## 2023-10-12 ENCOUNTER — Other Ambulatory Visit (HOSPITAL_BASED_OUTPATIENT_CLINIC_OR_DEPARTMENT_OTHER): Payer: Self-pay

## 2023-10-12 MED ORDER — OXYCODONE HCL 10 MG PO TABS
10.0000 mg | ORAL_TABLET | Freq: Three times a day (TID) | ORAL | 0 refills | Status: DC | PRN
  Filled 2023-10-19 (×2): qty 90, 30d supply, fill #0

## 2023-10-12 MED ORDER — MORPHINE SULFATE ER 15 MG PO TBCR
15.0000 mg | EXTENDED_RELEASE_TABLET | Freq: Three times a day (TID) | ORAL | 0 refills | Status: DC
Start: 2023-10-12 — End: 2023-11-13
  Filled 2023-10-17: qty 90, 30d supply, fill #0

## 2023-10-12 MED ORDER — BACLOFEN 5 MG PO TABS
5.0000 mg | ORAL_TABLET | Freq: Two times a day (BID) | ORAL | 0 refills | Status: DC | PRN
  Filled 2023-10-12: qty 60, 30d supply, fill #0

## 2023-10-14 ENCOUNTER — Other Ambulatory Visit (HOSPITAL_BASED_OUTPATIENT_CLINIC_OR_DEPARTMENT_OTHER): Payer: Self-pay

## 2023-10-15 ENCOUNTER — Other Ambulatory Visit (HOSPITAL_BASED_OUTPATIENT_CLINIC_OR_DEPARTMENT_OTHER): Payer: Self-pay

## 2023-10-17 ENCOUNTER — Other Ambulatory Visit: Payer: Self-pay

## 2023-10-17 ENCOUNTER — Other Ambulatory Visit (HOSPITAL_BASED_OUTPATIENT_CLINIC_OR_DEPARTMENT_OTHER): Payer: Self-pay

## 2023-10-17 MED ORDER — LEVOTHYROXINE SODIUM 75 MCG PO TABS
75.0000 ug | ORAL_TABLET | Freq: Every day | ORAL | 0 refills | Status: DC
  Filled 2023-10-17: qty 60, 60d supply, fill #0

## 2023-10-17 MED ORDER — LEVOTHYROXINE SODIUM 75 MCG PO TABS
75.0000 ug | ORAL_TABLET | Freq: Every day | ORAL | 0 refills | Status: AC
  Filled 2023-10-17: qty 60, 60d supply, fill #0

## 2023-10-19 ENCOUNTER — Other Ambulatory Visit (HOSPITAL_BASED_OUTPATIENT_CLINIC_OR_DEPARTMENT_OTHER): Payer: Self-pay

## 2023-10-19 ENCOUNTER — Encounter (HOSPITAL_COMMUNITY): Payer: Self-pay | Admitting: Emergency Medicine

## 2023-10-19 ENCOUNTER — Other Ambulatory Visit: Payer: Self-pay

## 2023-10-19 ENCOUNTER — Emergency Department (HOSPITAL_COMMUNITY)
Admission: EM | Admit: 2023-10-19 | Discharge: 2023-10-19 | Disposition: A | Discharge status: Home / Self Care | Attending: Emergency Medicine | Admitting: Emergency Medicine

## 2023-10-19 DIAGNOSIS — R040 Epistaxis: Secondary | ICD-10-CM | POA: Diagnosis present

## 2023-10-19 LAB — CBC WITH DIFFERENTIAL/PLATELET
Abs Immature Granulocytes: 0.03 K/uL (ref 0.00–0.07)
Basophils Absolute: 0 K/uL (ref 0.0–0.1)
Basophils Relative: 0 %
Eosinophils Absolute: 0 K/uL (ref 0.0–0.5)
Eosinophils Relative: 0 %
HCT: 34.9 % — ABNORMAL LOW (ref 36.0–46.0)
Hemoglobin: 11.1 g/dL — ABNORMAL LOW (ref 12.0–15.0)
Immature Granulocytes: 0 %
Lymphocytes Relative: 30 %
Lymphs Abs: 2.4 K/uL (ref 0.7–4.0)
MCH: 28.5 pg (ref 26.0–34.0)
MCHC: 31.8 g/dL (ref 30.0–36.0)
MCV: 89.7 fL (ref 80.0–100.0)
Monocytes Absolute: 0.6 K/uL (ref 0.1–1.0)
Monocytes Relative: 8 %
Neutro Abs: 4.7 K/uL (ref 1.7–7.7)
Neutrophils Relative %: 62 %
Platelets: 170 K/uL (ref 150–400)
RBC: 3.89 MIL/uL (ref 3.87–5.11)
RDW: 14.1 % (ref 11.5–15.5)
WBC: 7.8 K/uL (ref 4.0–10.5)
nRBC: 0 % (ref 0.0–0.2)

## 2023-10-19 LAB — BASIC METABOLIC PANEL WITH GFR
Anion gap: 14 (ref 5–15)
BUN: 22 mg/dL (ref 8–23)
CO2: 25 mmol/L (ref 22–32)
Calcium: 8.9 mg/dL (ref 8.9–10.3)
Chloride: 105 mmol/L (ref 98–111)
Creatinine, Ser: 1.04 mg/dL — ABNORMAL HIGH (ref 0.44–1.00)
GFR, Estimated: 58 mL/min — ABNORMAL LOW (ref >60)
Glucose, Bld: 111 mg/dL — ABNORMAL HIGH (ref 70–99)
Potassium: 3.4 mmol/L — ABNORMAL LOW (ref 3.5–5.1)
Sodium: 143 mmol/L (ref 135–145)

## 2023-10-19 MED ORDER — OXYMETAZOLINE HCL 0.05 % NA SOLN
1.0000 | Freq: Once | NASAL | Status: AC
  Administered 2023-10-19: 1 via NASAL
  Filled 2023-10-19: qty 30

## 2023-10-19 MED ORDER — LIDOCAINE-EPINEPHRINE-TETRACAINE (LET) TOPICAL GEL
3.0000 mL | Freq: Once | TOPICAL | Status: AC
  Administered 2023-10-19: 3 mL via TOPICAL
  Filled 2023-10-19: qty 3

## 2023-10-19 NOTE — Discharge Instructions (Addendum)
 You may use the Afrin nasal spray up to twice daily for the next three days. Please call the ear nose throat doctor to follow up.

## 2023-10-19 NOTE — ED Triage Notes (Signed)
 Pt presents to the ED via POV with complaints of epistaxis that started 4 hours ago. Pt had her port changed yesterday and noticed some mild bleeding from her nose last night which has progressively gotten worse. Increased bleeding over the last hour - not on thinners. Endorses some lightheadedness due to bleeding. States this happened before in April and it took several hours for it to slow down. A&Ox4 at this time. Denies CP or SOB.

## 2023-10-19 NOTE — ED Provider Notes (Signed)
 [place] EMERGENCY DEPARTMENT AT [facility] Provider Note   CSN: [id number] Arrival date & time: 10/19/23  9785     Patient presents with: Epistaxis   Morgan Juarez is a 68 y.o. female.   The history is provided by the patient.  Epistaxis Morgan Juarez is a 68 y.o. female who presents to the Emergency Department complaining of epistaxis. She presents the emergency department for evaluation of bleeding from her left nostril that started around 10 PM. She did have a history of similar bleeding in April, did not require packing at that time. I time of ED arrival she is bleeding from both nostrils. 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 She has not had her labs checked in several months. She does not take any blood thinners.     Prior to Admission medications   Medication Sig Start Date End Date Taking? Authorizing Provider  acetaminophen  (TYLENOL ) 325 MG tablet Take 2 tablets (650 mg total) by mouth every 6 (six) hours as needed for mild pain or headache. 10/04/16   Verdene Purchase, MD  Baclofen  5 MG TABS Take 1 tablet (5 mg total) by mouth at bedtime. 03/24/22     Baclofen  5 MG TABS Take 1 tablet (5 mg total) by mouth at bedtime. 08/24/22     Baclofen  5 MG TABS Take 1 tablet (5 mg total) by mouth 2 (two) times daily as needed for muscle spasms. 10/12/23     Calcium  Carb-Cholecalciferol 500-400 MG-UNIT CHEW  1 tablet, CHEWED, BID (2 times a day), 180 tablet, 0 Refill(s), Activate Med (Rx) 10/24/15   [provider]  calcium  carbonate (OS-CAL - DOSED IN MG OF ELEMENTAL CALCIUM ) 1250 (500 Ca) MG tablet Take 1 tablet (500 mg of elemental calcium  total) by mouth 3 (three) times daily with meals. 10/04/16   Krishnan, Gokul, MD  Calcium  Polycarbophil (FIBER) 625 MG TABS Take by mouth. 03/05/17   [provider]  carboxymethylcellulose (REFRESH PLUS) 0.5 % SOLN 1 Drop PRN for Discomfort.    [provider]   chlorthalidone  (HYGROTON ) 25 MG tablet Take 25 mg by mouth as needed. 08/21/21   [provider]  chlorthalidone  (HYGROTON ) 50 MG tablet Take 0.5-1 tablets (25-50 mg total) by mouth daily as needed for swelling. Do not start until after Furosemide prescription is complete. 07/31/22     chlorthalidone  (HYGROTON ) 50 MG tablet Take ONE-HALF to 1 tablets (25-50 mg total) by mouth as needed for swelling. Do NOT start until after Furosemide prescription is complete 04/18/23     chlorthalidone  (HYGROTON ) 50 MG tablet Take 0.5-1 tablets (25-50 mg total) by mouth daily as needed for swelling. Do not start until after Furosemide prescription is complete 06/13/23     chlorthalidone  (HYGROTON ) 50 MG tablet Take 0.5-1 tablets (25-50 mg total) by mouth as needed for swelling. Do not start until after Furosemide prescription is complete. 07/11/23     chlorthalidone  (HYGROTON ) 50 MG tablet Take 0.5-1 tablets (25-50 mg total) by mouth daily as needed for swelling. Do not start until after completion of Furosemide. 08/11/23     cholecalciferol (VITAMIN D) 1000 units tablet Take 1,000 Units by mouth daily.    [provider]  cyanocobalamin 1000 MCG tablet Take by mouth.    [provider]  cycloSPORINE (RESTASIS) 0.05 % ophthalmic emulsion  11/24/15   [provider]  denosumab  (PROLIA ) 60 MG/ML SOSY injection Inject 1 mL into the skin  every six months 07/31/22     denosumab  (PROLIA ) 60 MG/ML SOSY injection Inject 60 mg into the skin every 6 (six) months. 12/09/22     denosumab  (PROLIA ) 60 MG/ML SOSY injection Inject 60 mg into the skin every 6 (six) months. 02/09/23     DEXILANT 60 MG capsule Take 1 capsule by mouth daily. 10/06/19   [provider]  Docusate Sodium (DSS) 100 MG CAPS Take by mouth.    [provider]  DULoxetine  (CYMBALTA ) 30 MG capsule Take 1 capsule (30 mg total) by mouth daily. 10/05/16   Krishnan, Gokul, MD  DULoxetine  (CYMBALTA ) 30 MG capsule Take 1  capsule (30 mg total) by mouth every evening. 02/08/22     DULoxetine  (CYMBALTA ) 30 MG capsule Take 1 capsule by mouth every evening 04/09/22     DULoxetine  (CYMBALTA ) 30 MG capsule Take 1 capsule (30 mg total) by mouth every evening. 07/31/22     DULoxetine  (CYMBALTA ) 60 MG capsule Take 60 mg by mouth daily. Patient not taking: Reported on 02/03/2022    [provider]  DULoxetine  (CYMBALTA ) 60 MG capsule Take 1 capsule (60 mg total) by mouth daily. 10/05/16   Krishnan, Gokul, MD  DULoxetine  (CYMBALTA ) 60 MG capsule Take 1 capsule (60 mg total) by mouth in the morning. 10/22/22     famotidine  (PEPCID ) 40 MG tablet Take 40 mg by mouth at bedtime. 10/13/19   [provider]  famotidine  (PEPCID ) 40 MG tablet Take 1 tablet (40 mg total) by mouth at bedtime. 03/03/23     fluconazole  (DIFLUCAN ) 200 MG tablet Take 1 tablet (200 mg total) by mouth daily. 10/22/22     gabapentin  (NEURONTIN ) 300 MG capsule Take 3 capsules (900 mg total) by mouth 3 (three) times daily for neuropathy. 02/24/22     gabapentin  (NEURONTIN ) 300 MG capsule Take 3 capsules (900 mg total) by mouth 3 (three) times daily for neuropathy. 03/26/22     gabapentin  (NEURONTIN ) 300 MG capsule Take 3 capsules (900 mg total) by mouth 3 (three) times daily. 04/26/22     gabapentin  (NEURONTIN ) 300 MG capsule Take 3 capsules (900 mg total) by mouth daily for neuropathy. 05/25/22     gabapentin  (NEURONTIN ) 300 MG capsule Take 3 capsules (900 mg total) by mouth 3 (three) times daily for neuropathy 09/16/23     gabapentin  (NEURONTIN ) 400 MG capsule Take 2 capsules (800 mg total) by mouth 2 (two) times daily. Patient taking differently: Take 800 mg by mouth 3 (three) times daily. 10/04/16   Krishnan, Gokul, MD  gabapentin  (NEURONTIN ) 800 MG tablet Take 1 tablet (800 mg total) by mouth 3 (three) times daily. 06/07/22     gabapentin  (NEURONTIN ) 800 MG tablet Take 1 tablet (800 mg total) by mouth 3 (three) times daily. 12/06/22     gabapentin   (NEURONTIN ) 800 MG tablet Take 1 tablet (800 mg total) by mouth 3 (three) times daily. 06/06/23     GAMMAGARD 20 GM/200ML SOLN  10/28/21   [provider]  Heparin  Sod, Pork, Lock Flush (HEPARIN  FLUSH) 10 UNIT/ML injection Inject into the vein. 10/28/21   [provider]  levothyroxine  (SYNTHROID ) 75 MCG tablet Take 1 tablet (75 mcg total) by mouth daily. 01/13/22   Shamleffer, Ibtehal Jaralla, MD  levothyroxine  (SYNTHROID ) 75 MCG tablet Take 1 tablet (75 mcg total) by mouth every morning at 6am. 07/31/22     levothyroxine  (SYNTHROID ) 75 MCG tablet Take 1 tablet (75 mcg total) by mouth daily. 06/06/23  levothyroxine  (SYNTHROID ) 75 MCG tablet Take 1 tablet (75 mcg total) by mouth daily at 6am 10/17/23     lidocaine  (XYLOCAINE ) 2 % solution Use as directed 15 mLs in the mouth or throat as needed for Pain (Swish, hold, and spit as needed for mouth sores/pain.). 11/07/18   [provider]  linaclotide  (LINZESS ) 145 MCG CAPS capsule Take 1 capsule (145 mcg total) by mouth daily. 04/16/22     linaclotide  (LINZESS ) 145 MCG CAPS capsule Take 1 capsule (145 mcg total) by mouth daily. 04/26/22     linaclotide  (LINZESS ) 145 MCG CAPS capsule Take 1 capsule (145 mcg total) by mouth daily. 05/25/22     linaclotide  (LINZESS ) 145 MCG CAPS capsule Take 1 capsule (145 mcg total) by mouth daily. 08/24/22     linaclotide  (LINZESS ) 145 MCG CAPS capsule Take 1 capsule (145 mcg total) by mouth daily. 02/09/23     Liraglutide  -Weight Management (SAXENDA ) 18 MG/3ML SOPN Inject 3 mg into the skin daily. 12/28/21     Liraglutide  -Weight Management (SAXENDA ) 18 MG/3ML SOPN Inject 3 mg into the skin daily. 02/08/22     Liraglutide  -Weight Management (SAXENDA ) 18 MG/3ML SOPN Inject 3 mg into the skin daily. 03/16/22     Liraglutide  -Weight Management (SAXENDA ) 18 MG/3ML SOPN Inject 3 mg into the skin daily. 07/31/22     Liraglutide  -Weight Management (SAXENDA ) 18 MG/3ML SOPN 3 (three) Milligram daily injection under  skin 08/28/22     Liraglutide  -Weight Management (SAXENDA ) 18 MG/3ML SOPN Inject 3 mg into the skin daily. 10/22/22     Liraglutide  -Weight Management (SAXENDA ) 18 MG/3ML SOPN Inject 3 mg into the skin daily. 12/09/22     Liraglutide  -Weight Management (SAXENDA ) 18 MG/3ML SOPN Inject 3 mg into the skin daily. 12/19/22     Liraglutide  -Weight Management (SAXENDA ) 18 MG/3ML SOPN Inject 3 mg into the skin daily. 01/17/23     Liraglutide  -Weight Management (SAXENDA ) 18 MG/3ML SOPN Inject 3 mg into the skin daily. 02/09/23     Liraglutide  -Weight Management (SAXENDA ) 18 MG/3ML SOPN Inject 3 mg into the skin daily. 03/21/23     Liraglutide  -Weight Management (SAXENDA ) 18 MG/3ML SOPN Inject 3 mg into the skin daily. 04/18/23     Liraglutide  -Weight Management (SAXENDA ) 18 MG/3ML SOPN Inject 3 mg into the skin daily. 06/13/23     magnesium  oxide (MAG-OX) 400 MG tablet Take by mouth. 03/31/17   [provider]  melatonin (MELATONIN MAXIMUM STRENGTH) 5 MG TABS Take 1-2 tablets (5-10 mg total) by mouth at bedtime. 12/12/22     melatonin (MELATONIN MAXIMUM STRENGTH) 5 MG TABS Take 1-2 tablets (5-10 mg total) by mouth at bedtime. 01/12/23     melatonin (MELATONIN MAXIMUM STRENGTH) 5 MG TABS Take 1-2 tablets (5-10 mg total) by mouth at bedtime. 01/12/23     melatonin (MELATONIN MAXIMUM STRENGTH) 5 MG TABS Take 1-2 tablets (5-10 mg total) by mouth at bedtime. 03/16/23     melatonin (MELATONIN MAXIMUM STRENGTH) 5 MG TABS Take 1-2 tablets (5-10 mg total) by mouth at bedtime. 06/29/23     melatonin (MELATONIN MAXIMUM STRENGTH) 5 MG TABS Take 1-2 tablets (5-10 mg total) by mouth at bedtime. 09/28/23     Melatonin 5 MG CAPS 1 TO 2 TABLET BY MOUTH AT BEDTIME 07/28/19   [provider]  metFORMIN (GLUCOPHAGE-XR) 500 MG 24 hr tablet Take 500 mg by mouth every morning. 09/23/21   [provider]  morphine  (MS CONTIN ) 15 MG 12 hr tablet  Take by mouth. 02/22/19   [provider]  morphine  (MS CONTIN ) 15  MG 12 hr tablet Take 1 tablet (15 mg total) by mouth 3 (three) times daily. 10/12/23     Multiple Vitamins-Iron (MULTIVITAMINS WITH IRON) TABS tablet Take 1 tablet by mouth daily.    [provider]  Naldemedine Tosylate  (SYMPROIC ) 0.2 MG TABS Take 1 tablet by mouth daily. 01/04/20   [provider]  Naldemedine Tosylate  (SYMPROIC ) 0.2 MG TABS Take 1 tablet (0.2 mg) by mouth daily. 04/16/22     Naldemedine Tosylate  (SYMPROIC ) 0.2 MG TABS Take 1 tablet by mouth daily. 02/09/23     naloxone  (NARCAN ) nasal spray 4 mg/0.1 mL  07/18/19   [provider]  naloxone  (NARCAN ) nasal spray 4 mg/0.1 mL Place 1 spray into the nose as needed. 11/19/22     naloxone  (NARCAN ) nasal spray 4 mg/0.1 mL Use as directed as needed for accidental overdose 05/16/23     nystatin  cream (MYCOSTATIN ) APPLY TO AFFECTED AREA TWICE A DAY 08/14/19   [provider]  nystatin  ointment (MYCOSTATIN ) Apply topically to affected area(s) 2 (two) times daily. 10/22/22     nystatin  ointment (MYCOSTATIN ) Apply to the affected area(s) two times daily. 08/11/23     ondansetron  (ZOFRAN ) 4 MG tablet Take 1 tablet (4 mg total) by mouth 3 (three) times daily as needed for nausea. 07/31/22     ondansetron  (ZOFRAN ) 4 MG tablet Take 1 tablet (4 mg total) by mouth 3 (three) times daily as needed for nausea. 12/09/22     ondansetron  (ZOFRAN ) 4 MG tablet Take 1 tablet (4 mg total) by mouth 3 (three) times daily as needed for nausea. 02/09/23     ondansetron  (ZOFRAN ) 4 MG tablet Take 1 tablet (4 mg total) by mouth 3 (three) times daily as needed for nausea. 08/11/23     ondansetron  (ZOFRAN ) 4 MG/2ML SOLN injection Inject 2 mLs (4 mg total) into the vein every 6 (six) hours as needed for nausea or vomiting. 10/04/16   Verdene Purchase, MD  oxyCODONE  (OXY IR/ROXICODONE ) 5 MG immediate release tablet Take by mouth. 10/20/18   [provider]  Oxycodone  HCl 10 MG TABS Take 1 tablet (10 mg total) by mouth 3 (three) times daily  as needed. 08/24/22     Oxycodone  HCl 10 MG TABS Take 1 tablet (10 mg total) by mouth 3 (three) times daily as needed. 09/21/22     Oxycodone  HCl 10 MG TABS Take 1 tablet (10 mg total) by mouth 3 (three) times daily as needed. 10/18/22     Oxycodone  HCl 10 MG TABS Take 1 tablet (10 mg total) by mouth 3 (three) times daily as needed. 11/19/22     Oxycodone  HCl 10 MG TABS Take 1 tablet (10 mg total) by mouth 3 (three) times daily as needed. 10/12/23     oxymetazoline  (AFRIN NASAL SPRAY) 0.05 % nasal spray Place 1 spray into both nostrils 2 (two) times daily as needed for congestion (Nosebleed). 06/10/23   Kingsley, Victoria K, DO  pantoprazole  (PROTONIX ) 40 MG tablet Take 1 tablet (40 mg total) by mouth 2 (two) times daily. 02/09/23     pantoprazole  (PROTONIX ) 40 MG tablet Take 1 tablet (40 mg total) by mouth 2 (two) times daily. 06/13/23     phentermine  (ADIPEX-P ) 37.5 MG tablet Take 1 tablet (37.5 mg total) by mouth daily. 12/19/22     phentermine  (ADIPEX-P ) 37.5 MG tablet Take 1 tablet (37.5 mg total) by mouth daily. 02/09/23  phentermine  (ADIPEX-P ) 37.5 MG tablet Take 1 tablet (37.5 mg total) by mouth daily. 03/21/23     phentermine  (ADIPEX-P ) 37.5 MG tablet Take 1 tablet (37.5 mg total) by mouth daily. 04/18/23     phentermine  (ADIPEX-P ) 37.5 MG tablet Take 1 tablet (37.5 mg total) by mouth daily. 06/13/23     phentermine  (ADIPEX-P ) 37.5 MG tablet Take 1 tablet (37.5 mg total) by mouth daily. 08/11/23     phentermine  15 MG capsule Take 1 capsule (15 mg total) by mouth daily. 03/22/22     phentermine  15 MG capsule Take 1 capsule (15 mg total) by mouth daily. 07/31/22     phentermine  30 MG capsule 1 (one) capsule by mouth daily 08/28/22     potassium chloride  (MICRO-K ) 10 MEQ CR capsule Take by mouth. 11/08/19   [provider]  pravastatin  (PRAVACHOL ) 40 MG tablet Take 40 mg by mouth daily. 01/08/22   [provider]  pravastatin  (PRAVACHOL ) 80 MG tablet Take 1 tablet (80 mg total) by mouth  daily. 05/11/23     pravastatin  (PRAVACHOL ) 80 MG tablet Take 1 tablet (80 mg total) by mouth daily. 08/11/23     promethazine (PHENERGAN) 12.5 MG tablet Take 12.5 mg by mouth every 4 (four) hours as needed (motion sickness).    [provider]  QUEtiapine  (SEROQUEL ) 25 MG tablet Take 1 tablet (25 mg total) by mouth in the morning. 01/12/23     QUEtiapine  (SEROQUEL ) 25 MG tablet Take 1 tablet (25 mg total) by mouth in the morning. 01/12/23     QUEtiapine  (SEROQUEL ) 25 MG tablet Take 1 tablet (25 mg total) by mouth in the morning. 03/16/23     QUEtiapine  (SEROQUEL ) 25 MG tablet Take 1 tablet (25 mg total) by mouth in the morning. 06/29/23     QUEtiapine  (SEROQUEL ) 50 MG tablet Take 1 tablet (50 mg total) by mouth at night 1-2 hours before bedtime. 01/12/23     QUEtiapine  (SEROQUEL ) 50 MG tablet Take 1 tablet (50 mg total) by mouth 1-2 hours before bedtime. 01/12/23     QUEtiapine  (SEROQUEL ) 50 MG tablet Take 1 tablet (50 mg total) by mouth 1-2 hours before bedtime. 03/16/23     QUEtiapine  (SEROQUEL ) 50 MG tablet Take 1 tablet (50 mg total) by mouth 1-2 hours before bedtime. 06/29/23     SAXENDA  18 MG/3ML SOPN Inject 3 mg into the skin daily. 01/19/22     sertraline  (ZOLOFT ) 100 MG tablet Take 1 tablet (100 mg total) by mouth daily. 07/31/22     sertraline  (ZOLOFT ) 100 MG tablet Take 1 tablet (100 mg total) by mouth daily. 01/12/23     sertraline  (ZOLOFT ) 100 MG tablet Take 1 tablet (100 mg total) by mouth daily. 01/12/23     sertraline  (ZOLOFT ) 100 MG tablet Take 1 tablet (100 mg total) by mouth daily. 03/16/23     sertraline  (ZOLOFT ) 100 MG tablet Take 1 tablet (100 mg total) by mouth daily. 06/29/23     sertraline  (ZOLOFT ) 100 MG tablet Take 1.5 tablets (150 mg total) by mouth daily. 06/29/23     sertraline  (ZOLOFT ) 100 MG tablet Take 1 tablet (100 mg total) by mouth daily. 09/28/23     sertraline  (ZOLOFT ) 50 MG tablet Take 75 mg by mouth daily. 10/24/21   [provider]  sodium chloride  (OCEAN)  0.65 % SOLN nasal spray Place 1 spray into both nostrils as needed for congestion. 06/10/23   Kingsley, Victoria K, DO  sodium chloride  0.9 % infusion Inject  1,000 mLs into the vein continuous. 10/04/16   Krishnan, Gokul, MD  sodium chloride  flush (NS) 0.9 % SOLN Inject 3 mLs into the vein every 12 (twelve) hours. 10/04/16   Krishnan, Gokul, MD  sodium chloride  flush (NS) 0.9 % SOLN Inject 3 mLs into the vein as needed. 10/04/16   Krishnan, Gokul, MD  sodium chloride  flush (NS) 0.9 % SOLN 10-40 mLs by Intracatheter route as needed (flush). 10/04/16   Krishnan, Gokul, MD  tiZANidine  (ZANAFLEX ) 4 MG tablet Take 1 tablet (4 mg total) by mouth 3 (three) times daily. 12/16/22     torsemide (DEMADEX) 5 MG tablet Take 5 mg by mouth daily. 10/19/21   [provider]  valACYclovir  (VALTREX ) 500 MG tablet Take by mouth.    [provider]  valACYclovir  (VALTREX ) 500 MG tablet Take 1 tablet (500 mg total) by mouth 2 (two) times daily. 04/26/22     valACYclovir  (VALTREX ) 500 MG tablet Take 1 tablet (500 mg total) by mouth 2 (two) times daily. 05/25/22     valACYclovir  (VALTREX ) 500 MG tablet Take 1 tablet (500 mg total) by mouth 2 (two) times daily. 07/31/22     valACYclovir  (VALTREX ) 500 MG tablet Take 1 tablet (500 mg total) by mouth 2 (two) times daily. 12/09/22     valACYclovir  (VALTREX ) 500 MG tablet Take 1 tablet (500 mg total) by mouth 2 (two) times daily. 02/09/23     valACYclovir  (VALTREX ) 500 MG tablet Take 1 tablet (500 mg total) by mouth 2 (two) times daily. 03/21/23     valACYclovir  (VALTREX ) 500 MG tablet Take 1 tablet (500 mg total) by mouth 2 (two) times daily. 08/11/23       Allergies: Fluogen [influenza virus vaccine] and Sulfamethoxazole -trimethoprim     Review of Systems  HENT:  Positive for nosebleeds.   All other systems reviewed and are negative.   Updated Vital Signs BP 124/76 (BP Location: Right Arm)   Pulse 78   Temp 98.6 F (37 C) (Oral)   Resp 18   Ht 5' 2.5 (1.588  m)   Wt 82.5 kg   SpO2 100%   BMI 32.74 kg/m   Physical Exam Vitals and nursing note reviewed.  Constitutional:      Appearance: She is well-developed.  HENT:     Head: Normocephalic and atraumatic.     Comments: Blood in bilateral nostrils, left greater than right with blood in the posterior oropharynx. Cardiovascular:     Rate and Rhythm: Normal rate and regular rhythm.     Heart sounds: No murmur heard. Pulmonary:     Effort: Pulmonary effort is normal. No respiratory distress.     Breath sounds: Normal breath sounds.  Abdominal:     Palpations: Abdomen is soft.     Tenderness: There is no abdominal tenderness. There is no guarding or rebound.  Musculoskeletal:        General: No tenderness.  Skin:    General: Skin is warm and dry.  Neurological:     Mental Status: She is alert and oriented to person, place, and time.  Psychiatric:        Behavior: Behavior normal.     (all labs ordered are listed, but only abnormal results are displayed) Labs Reviewed  BASIC METABOLIC PANEL WITH GFR - Abnormal; Notable for the following components:      Result Value   Potassium 3.4 (*)    Glucose, Bld 111 (*)    Creatinine, Ser 1.04 (*)  GFR, Estimated 58 (*)    All other components within normal limits  CBC WITH DIFFERENTIAL/PLATELET - Abnormal; Notable for the following components:   Hemoglobin 11.1 (*)    HCT 34.9 (*)    All other components within normal limits    EKG: None  Radiology: No results found.   Epistaxis Management  Date/Time: 10/19/2023 6:47 AM  Performed by: Griselda Norris, MD Authorized by: Griselda Norris, MD   Consent:    Consent obtained:  Verbal   Consent given by:  Patient   Risks discussed:  Bleeding, infection and nasal injury Universal protocol:    Patient identity confirmed:  Verbally with patient Anesthesia:    Anesthesia method:  Topical application   Topical anesthetic:  LET Procedure details:    Treatment episode: initial    Post-procedure details:    Assessment:  Bleeding stopped    Medications Ordered in the ED  oxymetazoline  (AFRIN) 0.05 % nasal spray 1 spray (1 spray Each Nare Given 10/19/23 0318)  lidocaine -EPINEPHrine -tetracaine  (LET) topical gel (3 mLs Topical Given 10/19/23 0318)                                    Medical Decision Making Amount and/or Complexity of Data Reviewed Labs: ordered.  Risk OTC drugs.   Patient with history of leukemia here for evaluation of left-sided epistaxis. Her nostrils were cleared of clots and Afrin with placed followed by LET soaked pledget that was placed in her left there. After this was removed her bleeding had stopped. She was observed for another 2 hours without any recurrent bleeding. After bleeding had stopped there is no clear source of her initial bleeding. CBC was obtained, which demonstrates mild anemia, no thrombocytopenia. Renal function is within normal limits. Feels she is stable for discharge home with home care for epistaxis with plan for ENT follow-up and return precautions.     Final diagnoses:  Left-sided epistaxis    ED Discharge Orders     None          Griselda Norris, MD 10/19/23 [phone]

## 2023-10-26 ENCOUNTER — Other Ambulatory Visit (HOSPITAL_BASED_OUTPATIENT_CLINIC_OR_DEPARTMENT_OTHER): Payer: Self-pay

## 2023-10-26 MED ORDER — SERTRALINE HCL 100 MG PO TABS: 100.0000 mg | ORAL_TABLET | Freq: Every day | ORAL | 0 refills | Status: AC

## 2023-10-26 MED ORDER — QUETIAPINE FUMARATE 25 MG PO TABS
25.0000 mg | ORAL_TABLET | Freq: Every day | ORAL | 2 refills | Status: AC
  Filled 2023-10-26: qty 30, 30d supply, fill #0

## 2023-10-26 MED ORDER — MELATONIN 5 MG PO TABS: 5.0000 mg | ORAL_TABLET | Freq: Every day | ORAL | 2 refills | Status: AC

## 2023-11-03 ENCOUNTER — Other Ambulatory Visit (HOSPITAL_COMMUNITY): Payer: Self-pay

## 2023-11-07 ENCOUNTER — Other Ambulatory Visit (HOSPITAL_BASED_OUTPATIENT_CLINIC_OR_DEPARTMENT_OTHER): Payer: Self-pay

## 2023-11-13 ENCOUNTER — Other Ambulatory Visit (HOSPITAL_BASED_OUTPATIENT_CLINIC_OR_DEPARTMENT_OTHER): Payer: Self-pay

## 2023-11-13 MED ORDER — OXYCODONE HCL 10 MG PO TABS
10.0000 mg | ORAL_TABLET | Freq: Three times a day (TID) | ORAL | 0 refills | Status: DC | PRN
  Filled 2023-11-16: qty 90, 30d supply, fill #0

## 2023-11-13 MED ORDER — PHENTERMINE HCL 37.5 MG PO TABS
37.5000 mg | ORAL_TABLET | Freq: Every day | ORAL | 0 refills | Status: AC
  Filled 2023-11-13: qty 30, 30d supply, fill #0

## 2023-11-13 MED ORDER — MORPHINE SULFATE ER 15 MG PO TBCR
15.0000 mg | EXTENDED_RELEASE_TABLET | Freq: Three times a day (TID) | ORAL | 0 refills | Status: DC
  Filled 2023-11-15: qty 90, 30d supply, fill #0

## 2023-11-13 MED ORDER — BACLOFEN 5 MG PO TABS
5.0000 mg | ORAL_TABLET | Freq: Two times a day (BID) | ORAL | 0 refills | Status: DC | PRN
  Filled 2023-11-13: qty 60, 30d supply, fill #0

## 2023-11-14 ENCOUNTER — Other Ambulatory Visit (HOSPITAL_BASED_OUTPATIENT_CLINIC_OR_DEPARTMENT_OTHER): Payer: Self-pay

## 2023-11-14 ENCOUNTER — Other Ambulatory Visit: Payer: Self-pay

## 2023-11-15 ENCOUNTER — Other Ambulatory Visit (HOSPITAL_BASED_OUTPATIENT_CLINIC_OR_DEPARTMENT_OTHER): Payer: Self-pay

## 2023-11-16 ENCOUNTER — Other Ambulatory Visit (HOSPITAL_BASED_OUTPATIENT_CLINIC_OR_DEPARTMENT_OTHER): Payer: Self-pay

## 2023-11-21 ENCOUNTER — Other Ambulatory Visit (HOSPITAL_BASED_OUTPATIENT_CLINIC_OR_DEPARTMENT_OTHER): Payer: Self-pay

## 2023-11-22 ENCOUNTER — Other Ambulatory Visit (HOSPITAL_BASED_OUTPATIENT_CLINIC_OR_DEPARTMENT_OTHER): Payer: Self-pay

## 2023-11-22 MED ORDER — LINZESS 145 MCG PO CAPS
145.0000 ug | ORAL_CAPSULE | Freq: Every day | ORAL | 3 refills | Status: AC
  Filled 2023-11-22: qty 30, 30d supply, fill #0
  Filled 2024-01-05: qty 30, 30d supply, fill #1

## 2023-11-24 ENCOUNTER — Other Ambulatory Visit (HOSPITAL_BASED_OUTPATIENT_CLINIC_OR_DEPARTMENT_OTHER): Payer: Self-pay

## 2023-11-26 ENCOUNTER — Other Ambulatory Visit (HOSPITAL_BASED_OUTPATIENT_CLINIC_OR_DEPARTMENT_OTHER): Payer: Self-pay

## 2023-11-30 ENCOUNTER — Other Ambulatory Visit (HOSPITAL_BASED_OUTPATIENT_CLINIC_OR_DEPARTMENT_OTHER): Payer: Self-pay

## 2023-11-30 MED ORDER — MELATONIN 5 MG PO TABS
5.0000 mg | ORAL_TABLET | Freq: Every day | ORAL | 2 refills | Status: AC
  Filled 2023-11-30: qty 60, 30d supply, fill #0

## 2023-11-30 MED ORDER — SERTRALINE HCL 150 MG PO CAPS
150.0000 mg | ORAL_CAPSULE | Freq: Every day | ORAL | 2 refills | Status: AC
  Filled 2023-11-30: qty 30, 30d supply, fill #0

## 2023-11-30 MED ORDER — QUETIAPINE FUMARATE 25 MG PO TABS
25.0000 mg | ORAL_TABLET | Freq: Every evening | ORAL | 2 refills | Status: AC | PRN
Start: 2023-11-30 — End: ?
  Filled 2023-11-30: qty 30, 30d supply, fill #0

## 2023-12-10 ENCOUNTER — Other Ambulatory Visit (HOSPITAL_BASED_OUTPATIENT_CLINIC_OR_DEPARTMENT_OTHER): Payer: Self-pay

## 2023-12-11 ENCOUNTER — Other Ambulatory Visit (HOSPITAL_BASED_OUTPATIENT_CLINIC_OR_DEPARTMENT_OTHER): Payer: Self-pay

## 2023-12-11 MED ORDER — BACLOFEN 5 MG PO TABS
5.0000 mg | ORAL_TABLET | Freq: Two times a day (BID) | ORAL | 0 refills | Status: AC | PRN
  Filled 2023-12-11: qty 60, 30d supply, fill #0

## 2023-12-12 ENCOUNTER — Other Ambulatory Visit: Payer: Self-pay

## 2023-12-12 ENCOUNTER — Other Ambulatory Visit (HOSPITAL_BASED_OUTPATIENT_CLINIC_OR_DEPARTMENT_OTHER): Payer: Self-pay

## 2023-12-12 MED ORDER — LEVOTHYROXINE SODIUM 75 MCG PO TABS
75.0000 ug | ORAL_TABLET | Freq: Every day | ORAL | 0 refills | Status: AC
Start: 2023-12-11 — End: ?
  Filled 2023-12-12: qty 30, 30d supply, fill #0

## 2023-12-12 MED ORDER — OXYCODONE HCL 10 MG PO TABS
10.0000 mg | ORAL_TABLET | Freq: Three times a day (TID) | ORAL | 0 refills | Status: DC | PRN
  Filled 2023-12-14: qty 90, 30d supply, fill #0

## 2023-12-12 MED ORDER — MORPHINE SULFATE ER 15 MG PO TBCR
15.0000 mg | EXTENDED_RELEASE_TABLET | Freq: Three times a day (TID) | ORAL | 0 refills | Status: DC
  Filled 2023-12-12 – 2023-12-14 (×2): qty 90, 30d supply, fill #0

## 2023-12-13 ENCOUNTER — Other Ambulatory Visit (HOSPITAL_BASED_OUTPATIENT_CLINIC_OR_DEPARTMENT_OTHER): Payer: Self-pay

## 2023-12-14 ENCOUNTER — Other Ambulatory Visit (HOSPITAL_BASED_OUTPATIENT_CLINIC_OR_DEPARTMENT_OTHER): Payer: Self-pay

## 2023-12-14 MED ORDER — POTASSIUM CHLORIDE ER 10 MEQ PO CPCR
20.0000 meq | ORAL_CAPSULE | Freq: Every day | ORAL | 1 refills | Status: AC
  Filled 2023-12-14: qty 180, 90d supply, fill #0

## 2024-01-04 ENCOUNTER — Other Ambulatory Visit: Payer: Self-pay

## 2024-01-04 ENCOUNTER — Other Ambulatory Visit (HOSPITAL_BASED_OUTPATIENT_CLINIC_OR_DEPARTMENT_OTHER): Payer: Self-pay

## 2024-01-04 MED ORDER — LEVOTHYROXINE SODIUM 88 MCG PO TABS
88.0000 ug | ORAL_TABLET | Freq: Every morning | ORAL | 1 refills | Status: AC
  Filled 2024-01-04: qty 90, 90d supply, fill #0

## 2024-01-05 ENCOUNTER — Other Ambulatory Visit (HOSPITAL_BASED_OUTPATIENT_CLINIC_OR_DEPARTMENT_OTHER): Payer: Self-pay

## 2024-01-07 ENCOUNTER — Other Ambulatory Visit (HOSPITAL_BASED_OUTPATIENT_CLINIC_OR_DEPARTMENT_OTHER): Payer: Self-pay

## 2024-01-07 MED ORDER — NALOXONE HCL 4 MG/0.1ML NA LIQD
NASAL | 3 refills | Status: AC
  Filled 2024-01-07: qty 2, 30d supply, fill #0

## 2024-01-07 MED ORDER — BACLOFEN 5 MG PO TABS
5.0000 mg | ORAL_TABLET | Freq: Two times a day (BID) | ORAL | 0 refills | Status: AC
  Filled 2024-01-07: qty 60, 30d supply, fill #0

## 2024-01-07 MED ORDER — OXYCODONE HCL 10 MG PO TABS
10.0000 mg | ORAL_TABLET | Freq: Three times a day (TID) | ORAL | 0 refills | Status: DC | PRN
  Filled 2024-01-07: qty 90, 30d supply, fill #0

## 2024-01-07 MED ORDER — ZEPBOUND 2.5 MG/0.5ML ~~LOC~~ SOAJ
2.5000 mg | SUBCUTANEOUS | 1 refills | Status: AC
  Filled 2024-01-07: qty 2, 28d supply, fill #0

## 2024-01-07 MED ORDER — MORPHINE SULFATE ER 15 MG PO TBCR
15.0000 mg | EXTENDED_RELEASE_TABLET | Freq: Three times a day (TID) | ORAL | 0 refills | Status: DC
  Filled 2024-01-07: qty 90, 30d supply, fill #0

## 2024-01-07 MED ORDER — CHLORTHALIDONE 50 MG PO TABS
ORAL_TABLET | ORAL | 2 refills | Status: AC
  Filled 2024-01-07: qty 30, 30d supply, fill #0
  Filled 2024-03-08: qty 30, 30d supply, fill #1
  Filled 2024-03-25: qty 30, 30d supply, fill #2
  Filled ????-??-??: fill #2

## 2024-01-10 ENCOUNTER — Other Ambulatory Visit (HOSPITAL_BASED_OUTPATIENT_CLINIC_OR_DEPARTMENT_OTHER): Payer: Self-pay

## 2024-01-11 ENCOUNTER — Other Ambulatory Visit (HOSPITAL_BASED_OUTPATIENT_CLINIC_OR_DEPARTMENT_OTHER): Payer: Self-pay

## 2024-01-12 ENCOUNTER — Other Ambulatory Visit (HOSPITAL_BASED_OUTPATIENT_CLINIC_OR_DEPARTMENT_OTHER): Payer: Self-pay

## 2024-01-14 ENCOUNTER — Other Ambulatory Visit (HOSPITAL_BASED_OUTPATIENT_CLINIC_OR_DEPARTMENT_OTHER): Payer: Self-pay

## 2024-01-16 ENCOUNTER — Other Ambulatory Visit (HOSPITAL_BASED_OUTPATIENT_CLINIC_OR_DEPARTMENT_OTHER): Payer: Self-pay

## 2024-02-02 ENCOUNTER — Other Ambulatory Visit (HOSPITAL_BASED_OUTPATIENT_CLINIC_OR_DEPARTMENT_OTHER): Payer: Self-pay

## 2024-02-02 MED ORDER — NA SULFATE-K SULFATE-MG SULF 17.5-3.13-1.6 GM/177ML PO SOLN
ORAL | 0 refills | Status: AC
  Filled 2024-02-02: qty 354, 1d supply, fill #0

## 2024-02-07 ENCOUNTER — Other Ambulatory Visit (HOSPITAL_BASED_OUTPATIENT_CLINIC_OR_DEPARTMENT_OTHER): Payer: Self-pay

## 2024-02-07 MED ORDER — BACLOFEN 5 MG PO TABS
5.0000 mg | ORAL_TABLET | Freq: Two times a day (BID) | ORAL | 0 refills | Status: AC | PRN
  Filled 2024-02-07: qty 60, 30d supply, fill #0

## 2024-02-07 MED ORDER — MORPHINE SULFATE ER 15 MG PO TBCR
15.0000 mg | EXTENDED_RELEASE_TABLET | Freq: Three times a day (TID) | ORAL | 0 refills | Status: DC
  Filled 2024-02-09: qty 90, 30d supply, fill #0

## 2024-02-07 MED ORDER — OXYCODONE HCL 10 MG PO TABS
10.0000 mg | ORAL_TABLET | Freq: Three times a day (TID) | ORAL | 0 refills | Status: DC | PRN
  Filled 2024-02-09: qty 90, 30d supply, fill #0

## 2024-02-08 ENCOUNTER — Other Ambulatory Visit (HOSPITAL_BASED_OUTPATIENT_CLINIC_OR_DEPARTMENT_OTHER): Payer: Self-pay

## 2024-02-09 ENCOUNTER — Other Ambulatory Visit (HOSPITAL_BASED_OUTPATIENT_CLINIC_OR_DEPARTMENT_OTHER): Payer: Self-pay

## 2024-02-21 ENCOUNTER — Other Ambulatory Visit (HOSPITAL_BASED_OUTPATIENT_CLINIC_OR_DEPARTMENT_OTHER): Payer: Self-pay

## 2024-02-25 ENCOUNTER — Other Ambulatory Visit (HOSPITAL_BASED_OUTPATIENT_CLINIC_OR_DEPARTMENT_OTHER): Payer: Self-pay

## 2024-03-05 ENCOUNTER — Other Ambulatory Visit (HOSPITAL_BASED_OUTPATIENT_CLINIC_OR_DEPARTMENT_OTHER): Payer: Self-pay

## 2024-03-05 MED ORDER — OXYCODONE HCL 10 MG PO TABS
10.0000 mg | ORAL_TABLET | Freq: Three times a day (TID) | ORAL | 0 refills | Status: AC | PRN
  Filled 2024-03-08: qty 90, 30d supply, fill #0

## 2024-03-05 MED ORDER — ZEPBOUND 5 MG/0.5ML ~~LOC~~ SOAJ
5.0000 mg | SUBCUTANEOUS | 0 refills | Status: AC
  Filled 2024-03-05: qty 2, 28d supply, fill #0

## 2024-03-05 MED ORDER — MORPHINE SULFATE ER 15 MG PO TBCR
15.0000 mg | EXTENDED_RELEASE_TABLET | Freq: Three times a day (TID) | ORAL | 0 refills | Status: AC
  Filled 2024-03-08: qty 90, 30d supply, fill #0

## 2024-03-07 ENCOUNTER — Other Ambulatory Visit (HOSPITAL_BASED_OUTPATIENT_CLINIC_OR_DEPARTMENT_OTHER): Payer: Self-pay

## 2024-03-08 ENCOUNTER — Other Ambulatory Visit (HOSPITAL_BASED_OUTPATIENT_CLINIC_OR_DEPARTMENT_OTHER): Payer: Self-pay

## 2024-03-21 ENCOUNTER — Other Ambulatory Visit: Payer: Self-pay

## 2024-03-21 NOTE — Progress Notes (Signed)
 Disenrolled - specialty pharmacy unsuccessful in reaching patient and prolia  last dispensed 1.13.25 (380 days ago).

## 2024-03-25 ENCOUNTER — Other Ambulatory Visit (HOSPITAL_BASED_OUTPATIENT_CLINIC_OR_DEPARTMENT_OTHER): Payer: Self-pay

## 2024-03-25 ENCOUNTER — Other Ambulatory Visit: Payer: Self-pay

## 2024-03-26 ENCOUNTER — Other Ambulatory Visit (HOSPITAL_BASED_OUTPATIENT_CLINIC_OR_DEPARTMENT_OTHER): Payer: Self-pay
